# Patient Record
Sex: Male | Born: 1975 | Hispanic: Yes | Marital: Married | Smoking: Never smoker
Health system: Southern US, Community
[De-identification: ages and names within clinical notes are randomized; demographics above are authoritative.]

## PROBLEM LIST (undated history)

## (undated) DIAGNOSIS — R5383 Other fatigue: Secondary | ICD-10-CM

## (undated) DIAGNOSIS — K219 Gastro-esophageal reflux disease without esophagitis: Secondary | ICD-10-CM

## (undated) DIAGNOSIS — F32A Depression, unspecified: Secondary | ICD-10-CM

## (undated) DIAGNOSIS — F329 Major depressive disorder, single episode, unspecified: Secondary | ICD-10-CM

## (undated) DIAGNOSIS — R011 Cardiac murmur, unspecified: Secondary | ICD-10-CM

## (undated) DIAGNOSIS — R002 Palpitations: Secondary | ICD-10-CM

## (undated) DIAGNOSIS — F419 Anxiety disorder, unspecified: Secondary | ICD-10-CM

## (undated) DIAGNOSIS — F410 Panic disorder [episodic paroxysmal anxiety] without agoraphobia: Secondary | ICD-10-CM

## (undated) DIAGNOSIS — F411 Generalized anxiety disorder: Secondary | ICD-10-CM

## (undated) DIAGNOSIS — M542 Cervicalgia: Secondary | ICD-10-CM

## (undated) HISTORY — DX: Panic disorder (episodic paroxysmal anxiety): F41.0

## (undated) HISTORY — DX: Depression, unspecified: F32.A

## (undated) HISTORY — DX: Gastro-esophageal reflux disease without esophagitis: K21.9

## (undated) HISTORY — DX: Cardiac murmur, unspecified: R01.1

## (undated) HISTORY — DX: Major depressive disorder, single episode, unspecified: F32.9

## (undated) HISTORY — DX: Other fatigue: R53.83

## (undated) HISTORY — DX: Anxiety disorder, unspecified: F41.9

## (undated) HISTORY — DX: Generalized anxiety disorder: F41.1

## (undated) HISTORY — DX: Cervicalgia: M54.2

## (undated) HISTORY — PX: NO PAST SURGERIES: SHX2092

## (undated) HISTORY — DX: Palpitations: R00.2

---

## 2004-09-11 ENCOUNTER — Emergency Department: Payer: Self-pay | Admitting: Emergency Medicine

## 2008-08-08 ENCOUNTER — Emergency Department: Payer: Self-pay | Admitting: Emergency Medicine

## 2008-08-13 ENCOUNTER — Emergency Department: Payer: Self-pay | Admitting: Emergency Medicine

## 2009-03-10 ENCOUNTER — Emergency Department: Payer: Self-pay | Admitting: Emergency Medicine

## 2009-03-27 ENCOUNTER — Emergency Department: Payer: Self-pay | Admitting: Emergency Medicine

## 2009-11-10 ENCOUNTER — Emergency Department: Payer: Self-pay | Admitting: Emergency Medicine

## 2010-08-09 IMAGING — CR DG CHEST 2V
1 series · 2 of 2 positions shown · non-contrast
Comparison: none

REASON FOR EXAM: CP
COMMENTS:

PROCEDURE:     DXR - DXR CHEST PA (OR AP) AND LATERAL  - March 10, 2009  [DATE]
RESULT:     The lung fields are clear. No pneumonia, pneumothorax or pleural
effusion is seen. Heart size is normal. There is a mild thoracic scoliosis
with a convexity to the left.

[Series 1: view not recorded · 0.17mm/px · 2 of 2 slices shown]
[im 1/2]
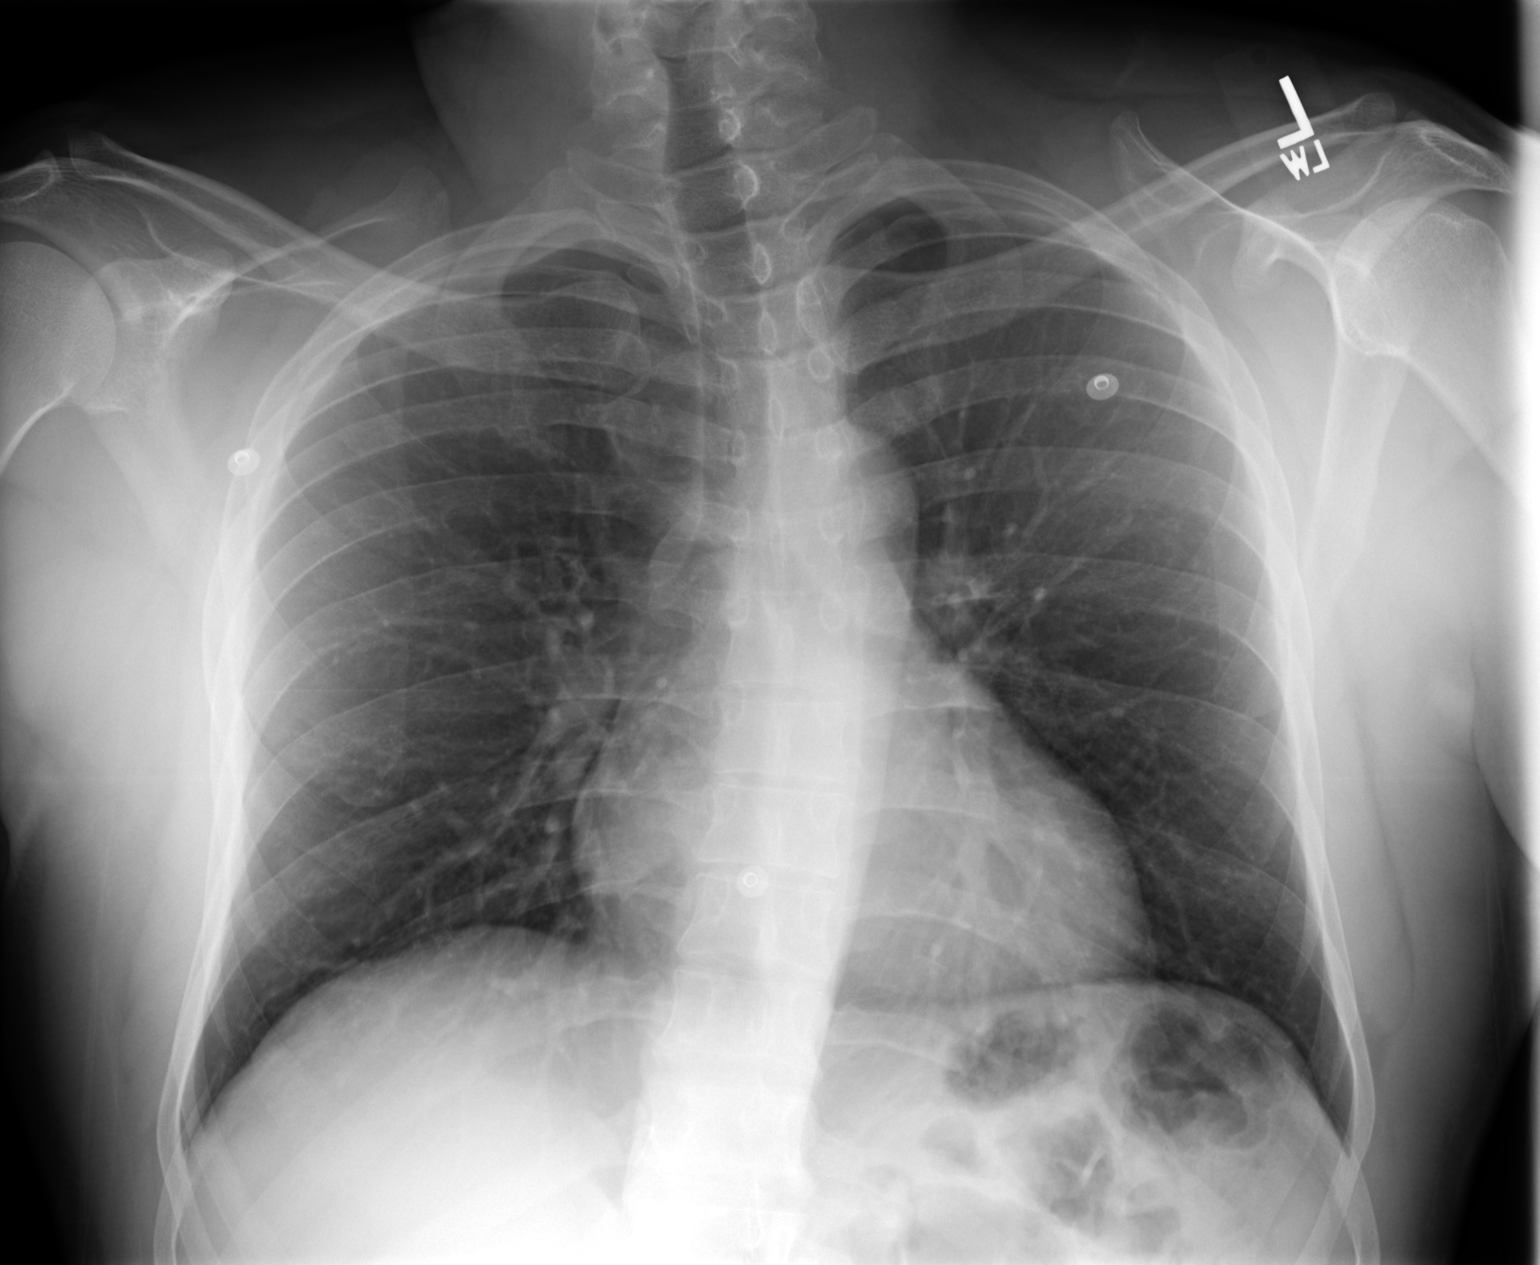
[im 2/2]
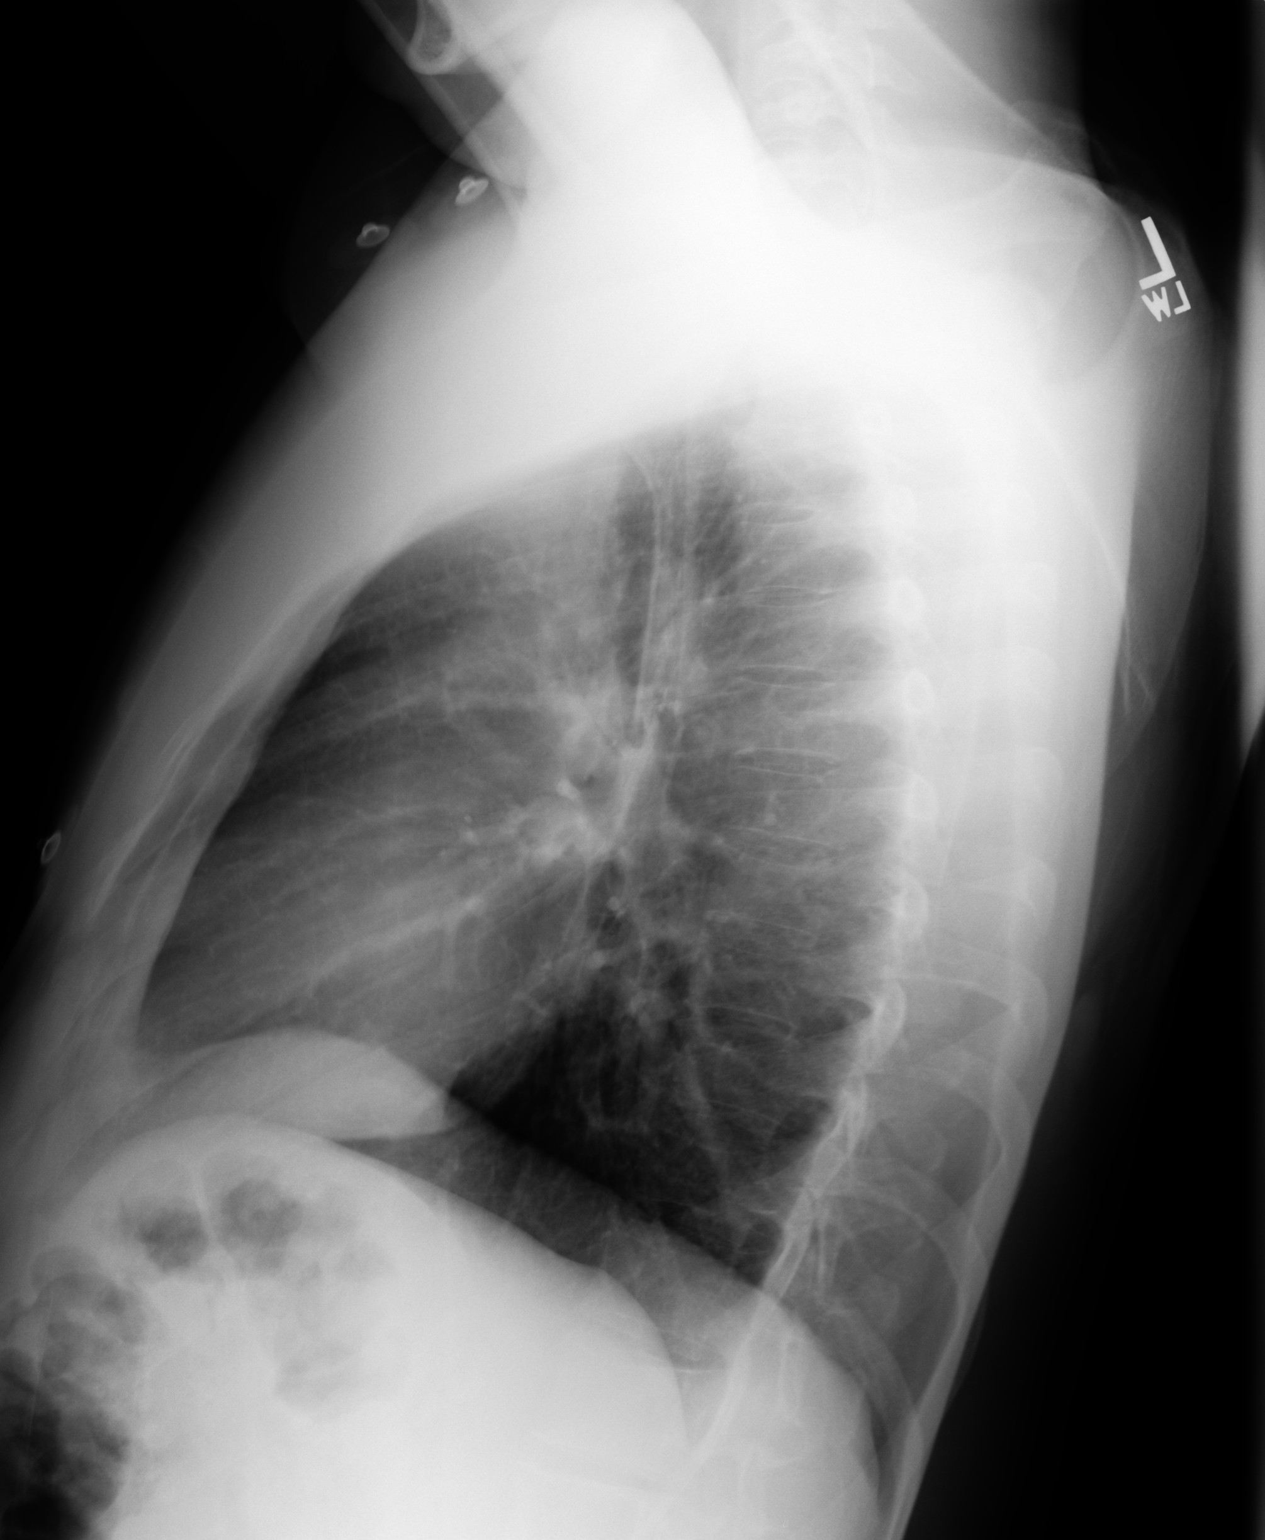

[2 of 2 positions shown; findings below may reference images not displayed]

IMPRESSION: No acute changes are identified.

## 2010-09-29 ENCOUNTER — Emergency Department: Payer: Self-pay | Admitting: Emergency Medicine

## 2011-05-08 DIAGNOSIS — R002 Palpitations: Secondary | ICD-10-CM | POA: Insufficient documentation

## 2011-05-08 HISTORY — DX: Palpitations: R00.2

## 2012-04-13 ENCOUNTER — Emergency Department: Payer: Self-pay | Admitting: Emergency Medicine

## 2012-04-13 LAB — CBC
HGB: 15.7 g/dL (ref 13.0–18.0)
MCH: 29.3 pg (ref 26.0–34.0)
MCV: 85 fL (ref 80–100)
Platelet: 204 10*3/uL (ref 150–440)
RDW: 14.3 % (ref 11.5–14.5)
WBC: 7.9 10*3/uL (ref 3.8–10.6)

## 2012-04-13 LAB — COMPREHENSIVE METABOLIC PANEL
Alkaline Phosphatase: 96 U/L (ref 50–136)
BUN: 12 mg/dL (ref 7–18)
Bilirubin,Total: 0.5 mg/dL (ref 0.2–1.0)
Calcium, Total: 8.8 mg/dL (ref 8.5–10.1)
Chloride: 104 mmol/L (ref 98–107)
Co2: 26 mmol/L (ref 21–32)
EGFR (Non-African Amer.): >60
Glucose: 125 mg/dL — ABNORMAL HIGH (ref 65–99)
Potassium: 3.5 mmol/L (ref 3.5–5.1)
SGOT(AST): 43 U/L — ABNORMAL HIGH (ref 15–37)
SGPT (ALT): 49 U/L (ref 12–78)

## 2012-04-18 ENCOUNTER — Emergency Department: Payer: Self-pay | Admitting: Unknown Physician Specialty

## 2012-04-18 LAB — BASIC METABOLIC PANEL
Anion Gap: 10 (ref 7–16)
BUN: 11 mg/dL (ref 7–18)
Calcium, Total: 9.3 mg/dL (ref 8.5–10.1)
Chloride: 106 mmol/L (ref 98–107)
Co2: 22 mmol/L (ref 21–32)
Creatinine: 1.01 mg/dL (ref 0.60–1.30)
EGFR (African American): >60
Glucose: 114 mg/dL — ABNORMAL HIGH (ref 65–99)
Osmolality: 276 (ref 275–301)
Potassium: 3 mmol/L — ABNORMAL LOW (ref 3.5–5.1)
Sodium: 138 mmol/L (ref 136–145)

## 2012-04-18 LAB — CBC
MCH: 29.8 pg (ref 26.0–34.0)
MCHC: 35.5 g/dL (ref 32.0–36.0)
Platelet: 237 10*3/uL (ref 150–440)
WBC: 8.5 10*3/uL (ref 3.8–10.6)

## 2013-02-02 ENCOUNTER — Emergency Department: Payer: Self-pay | Admitting: Emergency Medicine

## 2013-02-02 LAB — URINALYSIS, COMPLETE
Bacteria: NONE SEEN
Blood: NEGATIVE
Glucose,UR: NEGATIVE mg/dL (ref 0–75)
Ph: 6 (ref 4.5–8.0)
RBC,UR: <1 /HPF (ref 0–5)
Specific Gravity: 1.02 (ref 1.003–1.030)
Squamous Epithelial: <1
WBC UR: 3 /HPF (ref 0–5)

## 2013-02-02 LAB — BASIC METABOLIC PANEL
Anion Gap: 7 (ref 7–16)
BUN: 12 mg/dL (ref 7–18)
Calcium, Total: 8.9 mg/dL (ref 8.5–10.1)
Chloride: 107 mmol/L (ref 98–107)
Co2: 25 mmol/L (ref 21–32)
Creatinine: 1.04 mg/dL (ref 0.60–1.30)
EGFR (Non-African Amer.): >60
Glucose: 100 mg/dL — ABNORMAL HIGH (ref 65–99)
Osmolality: 277 (ref 275–301)
Potassium: 3.4 mmol/L — ABNORMAL LOW (ref 3.5–5.1)
Sodium: 139 mmol/L (ref 136–145)

## 2013-02-02 LAB — DRUG SCREEN, URINE
Barbiturates, Ur Screen: NEGATIVE (ref ?–200)
Benzodiazepine, Ur Scrn: NEGATIVE (ref ?–200)
Cannabinoid 50 Ng, Ur ~~LOC~~: NEGATIVE (ref ?–50)
Cocaine Metabolite,Ur ~~LOC~~: NEGATIVE (ref ?–300)
MDMA (Ecstasy)Ur Screen: NEGATIVE (ref ?–500)
Methadone, Ur Screen: NEGATIVE (ref ?–300)
Opiate, Ur Screen: NEGATIVE (ref ?–300)
Phencyclidine (PCP) Ur S: NEGATIVE (ref ?–25)
Tricyclic, Ur Screen: NEGATIVE (ref ?–1000)

## 2013-02-02 LAB — CBC
HCT: 46.8 % (ref 40.0–52.0)
HGB: 15.7 g/dL (ref 13.0–18.0)
MCH: 28.6 pg (ref 26.0–34.0)
MCHC: 33.7 g/dL (ref 32.0–36.0)
RBC: 5.5 10*6/uL (ref 4.40–5.90)

## 2014-08-01 ENCOUNTER — Emergency Department: Payer: Self-pay | Admitting: Emergency Medicine

## 2015-05-25 DIAGNOSIS — F329 Major depressive disorder, single episode, unspecified: Secondary | ICD-10-CM

## 2015-05-25 DIAGNOSIS — F411 Generalized anxiety disorder: Secondary | ICD-10-CM | POA: Insufficient documentation

## 2015-05-25 DIAGNOSIS — R011 Cardiac murmur, unspecified: Secondary | ICD-10-CM

## 2015-05-25 DIAGNOSIS — M542 Cervicalgia: Secondary | ICD-10-CM

## 2015-05-25 DIAGNOSIS — K219 Gastro-esophageal reflux disease without esophagitis: Secondary | ICD-10-CM

## 2015-05-25 DIAGNOSIS — F32A Depression, unspecified: Secondary | ICD-10-CM

## 2015-05-25 HISTORY — DX: Generalized anxiety disorder: F41.1

## 2015-05-25 HISTORY — DX: Major depressive disorder, single episode, unspecified: F32.9

## 2015-05-25 HISTORY — DX: Gastro-esophageal reflux disease without esophagitis: K21.9

## 2015-05-25 HISTORY — DX: Cardiac murmur, unspecified: R01.1

## 2015-05-29 ENCOUNTER — Ambulatory Visit (INDEPENDENT_AMBULATORY_CARE_PROVIDER_SITE_OTHER): Payer: No Typology Code available for payment source | Admitting: Unknown Physician Specialty

## 2015-05-29 ENCOUNTER — Encounter: Payer: Self-pay | Admitting: Unknown Physician Specialty

## 2015-05-29 VITALS — BP 117/80 | HR 65 | Temp 98.5°F | Ht 67.5 in | Wt 190.2 lb

## 2015-05-29 DIAGNOSIS — Z23 Encounter for immunization: Secondary | ICD-10-CM | POA: Diagnosis not present

## 2015-05-29 DIAGNOSIS — Z0000 Encounter for general adult medical examination without abnormal findings: Secondary | ICD-10-CM | POA: Diagnosis not present

## 2015-05-29 DIAGNOSIS — R5383 Other fatigue: Secondary | ICD-10-CM

## 2015-05-29 LAB — MICROSCOPIC EXAMINATION: RENAL EPITHEL UA: NONE SEEN /HPF

## 2015-05-29 LAB — UA/M W/RFLX CULTURE, ROUTINE
Bilirubin, UA: NEGATIVE
GLUCOSE, UA: NEGATIVE
Ketones, UA: NEGATIVE
Leukocytes, UA: NEGATIVE
NITRITE UA: NEGATIVE
PH UA: 6 (ref 5.0–7.5)
RBC, UA: NEGATIVE
Specific Gravity, UA: 1.025 (ref 1.005–1.030)
UUROB: 0.2 mg/dL (ref 0.2–1.0)

## 2015-05-29 NOTE — Progress Notes (Signed)
   BP 117/80 mmHg  Pulse 65  Temp(Src) 98.5 F (36.9 C)  Ht 5' 7.5" (1.715 m)  Wt 190 lb 3.2 oz (86.274 kg)  BMI 29.33 kg/m2  SpO2 98%   Subjective:    Patient ID: John Dyer, male    DOB: May 17, 1976, 39 y.o.   MRN: [id number]  HPI: John Dyer is a 39 y.o. male  Chief Complaint  Patient presents with  . Annual Exam    pt states he feels tired (especially after eating sometimes), and states he is having muscle spasms (states muscles feel achey even when he has not done anything). Pt also states his temples hurt.    Relevant past medical, surgical, family and social history reviewed and updated as indicated. Interim medical history since our last visit reviewed. Allergies and medications reviewed and updated.  Review of Systems  Constitutional: Positive for fatigue.  HENT: Negative.   Eyes: Negative.   Respiratory: Negative.   Cardiovascular: Negative.   Gastrointestinal: Negative.   Endocrine: Negative.   Genitourinary: Negative.   Skin: Negative.   Allergic/Immunologic: Negative.   Neurological: Negative.   Hematological: Negative.   Psychiatric/Behavioral: Negative.     Per HPI unless specifically indicated above     Objective:    BP 117/80 mmHg  Pulse 65  Temp(Src) 98.5 F (36.9 C)  Ht 5' 7.5" (1.715 m)  Wt 190 lb 3.2 oz (86.274 kg)  BMI 29.33 kg/m2  SpO2 98%  Wt Readings from Last 3 Encounters:  05/29/15 190 lb 3.2 oz (86.274 kg)  07/26/14 194 lb (87.998 kg)    Physical Exam  Constitutional: He is oriented to person, place, and time. He appears well-developed and well-nourished.  HENT:  Head: Normocephalic.  Eyes: Pupils are equal, round, and reactive to light.  Cardiovascular: Normal rate, regular rhythm and normal heart sounds.   Pulmonary/Chest: Effort normal.  Abdominal: Soft. Bowel sounds are normal.  Musculoskeletal: Normal range of motion.  Neurological: He is alert and oriented to person, place, and time. He has normal reflexes.  Skin:  Skin is warm and dry.  Psychiatric: He has a normal mood and affect. His behavior is normal. Judgment and thought content normal.      Assessment & Plan:   Problem List Items Addressed This Visit    None    Visit Diagnoses    Immunization due    -  Primary    Relevant Orders    Flu Vaccine QUAD 36+ mos PF IM (Fluarix & Fluzone Quad PF) (Completed)    Annual physical exam        Relevant Orders    CBC with Differential/Platelet    Comprehensive metabolic panel    HIV antibody    TSH    Lipid Panel w/o Chol/HDL Ratio    Vit D  25 hydroxy (rtn osteoporosis monitoring)    UA/M w/rflx Culture, Routine    Other fatigue            Follow up plan: Return in about 1 year (around 05/28/2016).

## 2015-05-30 ENCOUNTER — Encounter: Payer: Self-pay | Admitting: Unknown Physician Specialty

## 2015-05-30 LAB — CBC WITH DIFFERENTIAL/PLATELET
BASOS ABS: 0.1 10*3/uL (ref 0.0–0.2)
Basos: 1 %
EOS (ABSOLUTE): 0.4 10*3/uL (ref 0.0–0.4)
Eos: 6 %
HEMOGLOBIN: 17.2 g/dL (ref 12.6–17.7)
Hematocrit: 49 % (ref 37.5–51.0)
Immature Grans (Abs): 0 10*3/uL (ref 0.0–0.1)
Immature Granulocytes: 0 %
LYMPHS ABS: 2.3 10*3/uL (ref 0.7–3.1)
LYMPHS: 35 %
MCH: 29.9 pg (ref 26.6–33.0)
MCHC: 35.1 g/dL (ref 31.5–35.7)
MCV: 85 fL (ref 79–97)
MONOCYTES: 6 %
Monocytes Absolute: 0.4 10*3/uL (ref 0.1–0.9)
NEUTROS PCT: 52 %
Neutrophils Absolute: 3.4 10*3/uL (ref 1.4–7.0)
Platelets: 252 10*3/uL (ref 150–379)
RBC: 5.76 x10E6/uL (ref 4.14–5.80)
RDW: 14.6 % (ref 12.3–15.4)
WBC: 6.5 10*3/uL (ref 3.4–10.8)

## 2015-05-30 LAB — LIPID PANEL W/O CHOL/HDL RATIO
Cholesterol, Total: 237 mg/dL — ABNORMAL HIGH (ref 100–199)
HDL: 48 mg/dL (ref >39)
LDL Calculated: 141 mg/dL — ABNORMAL HIGH (ref 0–99)
Triglycerides: 242 mg/dL — ABNORMAL HIGH (ref 0–149)
VLDL Cholesterol Cal: 48 mg/dL — ABNORMAL HIGH (ref 5–40)

## 2015-05-30 LAB — COMPREHENSIVE METABOLIC PANEL
ALBUMIN: 4.6 g/dL (ref 3.5–5.5)
ALK PHOS: 84 IU/L (ref 39–117)
ALT: 41 IU/L (ref 0–44)
AST: 29 IU/L (ref 0–40)
Albumin/Globulin Ratio: 1.7 (ref 1.1–2.5)
BUN / CREAT RATIO: 14 (ref 8–19)
BUN: 10 mg/dL (ref 6–20)
Bilirubin Total: 0.5 mg/dL (ref 0.0–1.2)
CO2: 25 mmol/L (ref 18–29)
CREATININE: 0.74 mg/dL — AB (ref 0.76–1.27)
Calcium: 9.8 mg/dL (ref 8.7–10.2)
Chloride: 101 mmol/L (ref 97–108)
GFR calc Af Amer: 136 mL/min/{1.73_m2} (ref >59)
GFR calc non Af Amer: 118 mL/min/{1.73_m2} (ref >59)
GLUCOSE: 77 mg/dL (ref 65–99)
Globulin, Total: 2.7 g/dL (ref 1.5–4.5)
Potassium: 4.7 mmol/L (ref 3.5–5.2)
Sodium: 142 mmol/L (ref 134–144)
Total Protein: 7.3 g/dL (ref 6.0–8.5)

## 2015-05-30 LAB — VITAMIN D 25 HYDROXY (VIT D DEFICIENCY, FRACTURES): VIT D 25 HYDROXY: 19.4 ng/mL — AB (ref 30.0–100.0)

## 2015-05-30 LAB — TSH: TSH: 1.37 u[IU]/mL (ref 0.450–4.500)

## 2015-05-30 LAB — HIV ANTIBODY (ROUTINE TESTING W REFLEX): HIV SCREEN 4TH GENERATION: NONREACTIVE

## 2015-06-06 ENCOUNTER — Emergency Department
Admission: EM | Admit: 2015-06-06 | Discharge: 2015-06-06 | Disposition: A | Discharge status: Home / Self Care | Payer: No Typology Code available for payment source | Attending: Emergency Medicine | Admitting: Emergency Medicine

## 2015-06-06 ENCOUNTER — Emergency Department: Payer: No Typology Code available for payment source

## 2015-06-06 ENCOUNTER — Encounter: Payer: Self-pay | Admitting: Emergency Medicine

## 2015-06-06 DIAGNOSIS — Z79899 Other long term (current) drug therapy: Secondary | ICD-10-CM | POA: Insufficient documentation

## 2015-06-06 DIAGNOSIS — K219 Gastro-esophageal reflux disease without esophagitis: Secondary | ICD-10-CM | POA: Insufficient documentation

## 2015-06-06 DIAGNOSIS — R0789 Other chest pain: Secondary | ICD-10-CM | POA: Diagnosis present

## 2015-06-06 DIAGNOSIS — Z880 Allergy status to penicillin: Secondary | ICD-10-CM | POA: Diagnosis not present

## 2015-06-06 LAB — BASIC METABOLIC PANEL
ANION GAP: 8 (ref 5–15)
BUN: 10 mg/dL (ref 6–20)
CALCIUM: 9.4 mg/dL (ref 8.9–10.3)
CO2: 32 mmol/L (ref 22–32)
Chloride: 102 mmol/L (ref 101–111)
Creatinine, Ser: 0.85 mg/dL (ref 0.61–1.24)
GFR calc Af Amer: >60 mL/min (ref >60)
Glucose, Bld: 97 mg/dL (ref 65–99)
POTASSIUM: 3.6 mmol/L (ref 3.5–5.1)
SODIUM: 142 mmol/L (ref 135–145)

## 2015-06-06 LAB — HEPATIC FUNCTION PANEL
ALBUMIN: 4.5 g/dL (ref 3.5–5.0)
ALT: 45 U/L (ref 17–63)
AST: 39 U/L (ref 15–41)
Alkaline Phosphatase: 66 U/L (ref 38–126)
BILIRUBIN TOTAL: 0.8 mg/dL (ref 0.3–1.2)
Bilirubin, Direct: 0.1 mg/dL (ref 0.1–0.5)
Indirect Bilirubin: 0.7 mg/dL (ref 0.3–0.9)
TOTAL PROTEIN: 7.3 g/dL (ref 6.5–8.1)

## 2015-06-06 LAB — CBC
HEMATOCRIT: 46.8 % (ref 40.0–52.0)
HEMOGLOBIN: 16 g/dL (ref 13.0–18.0)
MCH: 29.3 pg (ref 26.0–34.0)
MCHC: 34.3 g/dL (ref 32.0–36.0)
MCV: 85.6 fL (ref 80.0–100.0)
Platelets: 237 10*3/uL (ref 150–440)
RBC: 5.46 MIL/uL (ref 4.40–5.90)
RDW: 14.3 % (ref 11.5–14.5)
WBC: 8.7 10*3/uL (ref 3.8–10.6)

## 2015-06-06 LAB — LIPASE, BLOOD: LIPASE: 35 U/L (ref 22–51)

## 2015-06-06 LAB — TROPONIN I: Troponin I: <0.03 ng/mL (ref <0.031)

## 2015-06-06 NOTE — ED Notes (Signed)
Patient ambulatory to triage with steady gait, without difficulty or distress noted; pt reports last night after "relations with my wife", began having mid chest pressure accomp by SOB, constant since; denies hx of same

## 2015-06-06 NOTE — ED Provider Notes (Addendum)
[facility] Emergency Department Provider Note  ____________________________________________   I have reviewed the triage vital signs and the nursing notes.   HISTORY  Chief Complaint Chest Pain    HPI John Dyer is a 39 y.o. male , healthy, history of GERD, anxiety, panic disorder, multiple different EKGs at this facility, and remote history of heart murmur presents today with "sour stomach" and discomfort in his chest which he thinks is reflux disease. His been there uninterrupted for the last24 hours. He states to me that he is not short of breath, he has no pleuritic chest pain. Triage note is noted. Patient did have a little epigastric discomfort as well. He take lime and baking soda at his mother's suggestion and his symptoms are now "pretty much gone". He has had no fever no chills no exertional chest pain or recent travel no personal or family history of PE or CAD no personal or family history of DVT, as had no recent travel he takes no estrogen she has no history of cancer he has no recent surgeries he has no leg swelling and he has no exertional chest discomfort. The patient denies any hematemesis nausea vomiting or diarrhea. He denies any melena or bright blood per rectum. He states right now he feels "pretty good". The only thing that made the symptoms worse with food and the only thing that made it better with his bicarbonate and Lime combination  Past Medical History  Diagnosis Date  . Heart murmur   . Depression   . GERD (gastroesophageal reflux disease)   . Anxiety   . Panic disorder   . Cervicalgia   . Fatigue     Patient Active Problem List   Diagnosis Date Noted  . Heart murmur 05/25/2015  . Depressive disorder 05/25/2015  . GERD (gastroesophageal reflux disease) 05/25/2015  . GAD (generalized anxiety disorder) 05/25/2015  . Cervicalgia 05/25/2015    History reviewed. No pertinent past surgical history.  Current Outpatient Rx  Name   Route  Sig  Dispense  Refill  . dorzolamide-timolol (COSOPT) 22.3-6.8 MG/ML ophthalmic solution   Both Eyes   Place 1 drop into both eyes daily.           Allergies Penicillin g benzathine and Prozac  Family History  Problem Relation Age of Onset  . Heart disease Father     murmur  . Stroke Father   . Cancer Maternal Grandmother     leukemia    Social History Social History  Substance Use Topics  . Smoking status: Never Smoker   . Smokeless tobacco: Never Used  . Alcohol Use: No    Review of Systems Constitutional: No fever/chills Eyes: No visual changes. ENT: No sore throat. No stiff neck no neck pain Cardiovascular: See history of present illness Respiratory: Denies shortness of breath. Gastrointestinal:   no vomiting.  No diarrhea.  No constipation. Genitourinary: Negative for dysuria. Musculoskeletal: Negative lower extremity swelling Skin: Negative for rash. Neurological: Negative for headaches, focal weakness or numbness. 10-point ROS otherwise negative.  ____________________________________________   PHYSICAL EXAM:  VITAL SIGNS: ED Triage Vitals  Enc Vitals Group     BP 06/06/15 2005 160/87 mmHg     Pulse Rate 06/06/15 2005 81     Resp 06/06/15 2005 20     Temp 06/06/15 2005 98 F (36.7 C)     Temp Source 06/06/15 2005 Oral     SpO2 06/06/15 2005 98 %     Weight 06/06/15 2005 190  lb (86.183 kg)     Height 06/06/15 2005 5\' 8"  (1.727 m)     Head Cir --      Peak Flow --      Pain Score 06/06/15 2003 2     Pain Loc --      Pain Edu? --      Excl. in GC? --     Constitutional: Alert and oriented. Well appearing and in no acute distress. Eyes: Conjunctivae are normal. PERRL. EOMI. Head: Atraumatic. Nose: No congestion/rhinnorhea. Mouth/Throat: Mucous membranes are moist.  Oropharynx non-erythematous. Neck: No stridor.   Nontender with no meningismus Cardiovascular: Normal rate, regular rhythm. Grossly normal heart sounds.  Good peripheral  circulation. Respiratory: Normal respiratory effort.  No retractions. Lungs CTAB. Gastrointestinal: Soft minimal epigastric tenderness noted, no right upper quadrant pain No distention. No guarding no rebound Back:  There is no focal tenderness or step off there is no midline tenderness there are no lesions noted. there is no CVA tenderness Musculoskeletal: No lower extremity tenderness. No joint effusions, no DVT signs strong distal pulses no edema Neurologic:  Normal speech and language. No gross focal neurologic deficits are appreciated.  Skin:  Skin is warm, dry and intact. No rash noted. Psychiatric: Mood and affect are normal. Speech and behavior are normal.  ____________________________________________   LABS (all labs ordered are listed, but only abnormal results are displayed)  Labs Reviewed  BASIC METABOLIC PANEL  CBC  TROPONIN I   ____________________________________________  EKG  EKG interpreted by me, normal sinus rhythm rate 76 bpm no acute ST elevation or depression, mostly ST changes. LAD noted. ____________________________________________  RADIOLOGY  Radiology reviewed by me ____________________________________________   PROCEDURES  Procedure(s) performed: None  Critical Care performed: None  ____________________________________________   INITIAL IMPRESSION / ASSESSMENT AND PLAN / ED COURSE  Pertinent labs & imaging results that were available during my care of the patient were reviewed by me and considered in my medical decision making (see chart for details).  Patient with epigastric pain and "burning" which is worse with food, better with baking soda, and somewhat reproducible midepigastric region. This is most like her GERD. I do not think that serial cardiac enzymes will be of great utility given the fact that the patient has had uninterrupted symptoms for the last 24 hours. At this time, he is acting pain-free after trying his bicarbonate home he  just wanted to "make sure" that everything is okay. Chest x-ray and blood levels and EKG are reassuring. There is no evidence of this time of acute gallbladder disease but we'll check hepatic function tests and her lipase is a precaution. Patient is perk negative and I do not think he has a PE. I do not think this represents myocarditis endocarditis pericarditis pneumothorax pneumonia or any other acute intrathoracic or intra-abdominal pathology at this time. Nothing to suggest dissection. ____________________________________________  ----------------------------------------- 11:16 PM on 06/06/2015 -----------------------------------------  Patient has no complaints sitting there in the bed with his legs crossed watching television. He has no pain here, he is eager to go home. Extensive return precautions and follow-up given and understood. Patient will follow closely with his primary care doctor. He will take antacids at home, he understands that he must come back for new or worrisome symptoms.    FINAL CLINICAL IMPRESSION(S) / ED DIAGNOSES  Final diagnoses:  None     Jeanmarie PlantJames A Wesson Stith, MD 06/06/15 2207  Jeanmarie PlantJames A Chucky Homes, MD 06/06/15 [phone]

## 2015-06-06 NOTE — Discharge Instructions (Signed)
At this time, we are reassured by what we see. If you have increased pain, fever, chills, shortness of breath, or you feel worse in any way please return to the emergency department.

## 2015-08-15 ENCOUNTER — Encounter: Payer: Self-pay | Admitting: Emergency Medicine

## 2015-08-15 ENCOUNTER — Emergency Department
Admission: EM | Admit: 2015-08-15 | Discharge: 2015-08-15 | Disposition: A | Discharge status: Home / Self Care | Payer: No Typology Code available for payment source | Attending: Emergency Medicine | Admitting: Emergency Medicine

## 2015-08-15 ENCOUNTER — Emergency Department: Payer: No Typology Code available for payment source

## 2015-08-15 ENCOUNTER — Other Ambulatory Visit: Payer: Self-pay

## 2015-08-15 DIAGNOSIS — Z79899 Other long term (current) drug therapy: Secondary | ICD-10-CM | POA: Diagnosis not present

## 2015-08-15 DIAGNOSIS — R002 Palpitations: Secondary | ICD-10-CM | POA: Diagnosis present

## 2015-08-15 DIAGNOSIS — F419 Anxiety disorder, unspecified: Secondary | ICD-10-CM | POA: Insufficient documentation

## 2015-08-15 DIAGNOSIS — R000 Tachycardia, unspecified: Secondary | ICD-10-CM | POA: Insufficient documentation

## 2015-08-15 DIAGNOSIS — Z880 Allergy status to penicillin: Secondary | ICD-10-CM | POA: Diagnosis not present

## 2015-08-15 LAB — BASIC METABOLIC PANEL
ANION GAP: 9 (ref 5–15)
BUN: 13 mg/dL (ref 6–20)
CALCIUM: 9.3 mg/dL (ref 8.9–10.3)
CO2: 26 mmol/L (ref 22–32)
Chloride: 103 mmol/L (ref 101–111)
Creatinine, Ser: 0.89 mg/dL (ref 0.61–1.24)
Glucose, Bld: 160 mg/dL — ABNORMAL HIGH (ref 65–99)
Potassium: 3.3 mmol/L — ABNORMAL LOW (ref 3.5–5.1)
Sodium: 138 mmol/L (ref 135–145)

## 2015-08-15 LAB — CBC
HCT: 46.5 % (ref 40.0–52.0)
Hemoglobin: 16 g/dL (ref 13.0–18.0)
MCH: 29 pg (ref 26.0–34.0)
MCHC: 34.4 g/dL (ref 32.0–36.0)
MCV: 84.4 fL (ref 80.0–100.0)
PLATELETS: 236 10*3/uL (ref 150–440)
RBC: 5.51 MIL/uL (ref 4.40–5.90)
RDW: 13.7 % (ref 11.5–14.5)
WBC: 8.4 10*3/uL (ref 3.8–10.6)

## 2015-08-15 LAB — TROPONIN I: Troponin I: <0.03 ng/mL (ref <0.031)

## 2015-08-15 MED ORDER — ALPRAZOLAM 0.5 MG PO TABS: 0.5000 mg | ORAL_TABLET | Freq: Every day | ORAL | Status: DC | PRN

## 2015-08-15 NOTE — Discharge Instructions (Signed)

## 2015-08-15 NOTE — ED Notes (Signed)
Pt states he felt like his heart rate was in the 180's around 1620 today, denies any pain and not taking any new meds. No acute distress noted.

## 2015-08-15 NOTE — ED Provider Notes (Signed)
[facility] Emergency Department Provider Note  ____________________________________________   I have reviewed the triage vital signs and the nursing notes.   HISTORY  Chief Complaint Palpitations    HPI John Dyer is a 39 y.o. male with a history of panic attacks states that he had a panic attack today. He states that his heart rate was a little bit up as he noticed on his Apple watch, and he became concerned about this and the more he thought about at the higher his heart rate would go. He denies any chest pain or shortness of breath. He states he became more and more anxious about it and not more anxious he became the higher his watch told him his heart rate was going.Patient states he is sure that this was an anxiety attack. He has had extensive workup for this in the past including Holter monitors, [facility] because he has a slight murmur had a normal echo and was assured that this was not cardiogenic. Patient has tried Prozac but it seemed like it made the symptoms worse. He has no SI or HI. Since he has been able to calm himself down here, the patient feels much better and his heart rate is Dr. normal heel like to go home. He has not seen a psychiatrist for this evening followed by his primary care doctor and he has never tried a benzodiazepine.  Past Medical History  Diagnosis Date  . Heart murmur   . Depression   . GERD (gastroesophageal reflux disease)   . Anxiety   . Panic disorder   . Cervicalgia   . Fatigue     Patient Active Problem List   Diagnosis Date Noted  . Heart murmur 05/25/2015  . Depressive disorder 05/25/2015  . GERD (gastroesophageal reflux disease) 05/25/2015  . GAD (generalized anxiety disorder) 05/25/2015  . Cervicalgia 05/25/2015    History reviewed. No pertinent past surgical history.  Current Outpatient Rx  Name  Route  Sig  Dispense  Refill  . dorzolamide-timolol (COSOPT) 22.3-6.8 MG/ML  ophthalmic solution   Both Eyes   Place 1 drop into both eyes daily.           Allergies Penicillin g benzathine and Prozac  Family History  Problem Relation Age of Onset  . Heart disease Father     murmur  . Stroke Father   . Cancer Maternal Grandmother     leukemia    Social History Social History  Substance Use Topics  . Smoking status: Never Smoker   . Smokeless tobacco: Never Used  . Alcohol Use: No    Review of Systems Constitutional: No fever/chills Eyes: No visual changes. ENT: No sore throat. No stiff neck no neck pain Cardiovascular: Denies chest pain. Respiratory: Denies shortness of breath. Gastrointestinal:   no vomiting.  No diarrhea.  No constipation. Genitourinary: Negative for dysuria. Musculoskeletal: Negative lower extremity swelling Skin: Negative for rash. Neurological: Negative for headaches, focal weakness or numbness. 10-point ROS otherwise negative.  ____________________________________________   PHYSICAL EXAM:  VITAL SIGNS: ED Triage Vitals  Enc Vitals Group     BP 08/15/15 1636 160/87 mmHg     Pulse Rate 08/15/15 1636 105     Resp 08/15/15 1636 20     Temp --      Temp src --      SpO2 08/15/15 1636 100 %     Weight 08/15/15 1636 180 lb (81.647 kg)  Height 08/15/15 1636  (1.753 m)     Head Cir --      Peak Flow --      Pain Score 08/15/15 1637 0     Pain Loc --      Pain Edu? --      Excl. in GC? --     Constitutional: Alert and oriented. Well appearing and in no acute distress. Eyes: Conjunctivae are normal. PERRL. EOMI. Head: Atraumatic. Nose: No congestion/rhinnorhea. Mouth/Throat: Mucous membranes are moist.  Oropharynx non-erythematous. Neck: No stridor.   Nontender with no meningismus Cardiovascular: Normal rate, regular rhythm. Grossly normal heart sounds.  Good peripheral circulation. Respiratory: Normal respiratory effort.  No retractions. Lungs CTAB. Abdominal: Soft and nontender. No distention. No  guarding no rebound Back:  There is no focal tenderness or step off there is no midline tenderness there are no lesions noted. there is no CVA tenderness Musculoskeletal: No lower extremity tenderness. No joint effusions, no DVT signs strong distal pulses no edema Neurologic:  Normal speech and language. No gross focal neurologic deficits are appreciated.  Skin:  Skin is warm, dry and intact. No rash noted. Psychiatric: Mood and affect are mildly anxious. Speech and behavior are normal.  ____________________________________________   LABS (all labs ordered are listed, but only abnormal results are displayed)  Labs Reviewed  BASIC METABOLIC PANEL - Abnormal; Notable for the following:    Potassium 3.3 (*)    Glucose, Bld 160 (*)    All other components within normal limits  TROPONIN I  CBC   ____________________________________________  EKG  I personally interpreted any EKGs ordered by me or triage Sinus tachycardia rate 104 no acute ST elevation or acute ST depression normal axis unremarkable EKG aside from mild tachycardia ____________________________________________  RADIOLOGY  I reviewed any imaging ordered by me or triage that were performed during my shift ____________________________________________   PROCEDURES  Procedure(s) performed: None  Critical Care performed: None  ____________________________________________   INITIAL IMPRESSION / ASSESSMENT AND PLAN / ED COURSE  Pertinent labs & imaging results that were available during my care of the patient were reviewed by me and considered in my medical decision making (see chart for details).  Patient apparently had an elevated heart rate at home to the extent that one can determine from an apple watch. Certainly his heart rate was elevated though because he has a history of panic attacks and elevated heart rate. This time his heart rate is 83. He is in no distress and eager to go home. I do not see anything that  I need to acutely intervene on at this time. Certainly possibly an SVT that is resolved but I think it more likely that he is correct and this was an anxiety attack. I will give him a prescription for benzodiazepines to take only in the case of significant panic attack, I will refer him to psychiatry. This for his heart is concerned I will refer him back to his [facility] cardiologist and he has a number and will call them I'll advise that he again where Holter monitor to ensure that there is no other organic cardiac pathology present. Nothing just that the patient has a PE or ACS or dissection or ongoing dysrhythmia. ____________________________________________   FINAL CLINICAL IMPRESSION(S) / ED DIAGNOSES  Final diagnoses:  None     Jeanmarie Plant, MD 08/15/15 2037

## 2015-08-15 NOTE — ED Notes (Signed)
Patient states he is improving but still feels like heart rate is high. Just wants to find out why this happened.

## 2015-09-22 ENCOUNTER — Ambulatory Visit: Payer: No Typology Code available for payment source | Admitting: Family Medicine

## 2015-10-26 ENCOUNTER — Other Ambulatory Visit: Payer: Self-pay | Admitting: Family Medicine

## 2015-10-26 ENCOUNTER — Ambulatory Visit: Payer: No Typology Code available for payment source | Admitting: Family Medicine

## 2015-10-26 ENCOUNTER — Encounter: Payer: Self-pay | Admitting: Family Medicine

## 2015-10-26 DIAGNOSIS — R300 Dysuria: Secondary | ICD-10-CM

## 2015-12-27 ENCOUNTER — Encounter: Payer: Self-pay | Admitting: *Deleted

## 2015-12-27 ENCOUNTER — Emergency Department: Payer: No Typology Code available for payment source

## 2015-12-27 ENCOUNTER — Emergency Department
Admission: EM | Admit: 2015-12-27 | Discharge: 2015-12-27 | Disposition: A | Discharge status: Home / Self Care | Payer: No Typology Code available for payment source | Attending: Emergency Medicine | Admitting: Emergency Medicine

## 2015-12-27 DIAGNOSIS — R079 Chest pain, unspecified: Secondary | ICD-10-CM

## 2015-12-27 DIAGNOSIS — R0789 Other chest pain: Secondary | ICD-10-CM | POA: Insufficient documentation

## 2015-12-27 DIAGNOSIS — F329 Major depressive disorder, single episode, unspecified: Secondary | ICD-10-CM | POA: Insufficient documentation

## 2015-12-27 LAB — CBC
HEMATOCRIT: 49.6 % (ref 40.0–52.0)
HEMOGLOBIN: 16.3 g/dL (ref 13.0–18.0)
MCH: 28.5 pg (ref 26.0–34.0)
MCHC: 32.9 g/dL (ref 32.0–36.0)
MCV: 86.6 fL (ref 80.0–100.0)
Platelets: 239 10*3/uL (ref 150–440)
RBC: 5.72 MIL/uL (ref 4.40–5.90)
RDW: 14.4 % (ref 11.5–14.5)
WBC: 9.6 10*3/uL (ref 3.8–10.6)

## 2015-12-27 LAB — URINALYSIS COMPLETE WITH MICROSCOPIC (ARMC ONLY)
BACTERIA UA: NONE SEEN
Bilirubin Urine: NEGATIVE
GLUCOSE, UA: NEGATIVE mg/dL
Hgb urine dipstick: NEGATIVE
Ketones, ur: NEGATIVE mg/dL
Leukocytes, UA: NEGATIVE
Nitrite: NEGATIVE
PROTEIN: NEGATIVE mg/dL
RBC / HPF: NONE SEEN RBC/hpf (ref 0–5)
Specific Gravity, Urine: 1.003 — ABNORMAL LOW (ref 1.005–1.030)
pH: 7 (ref 5.0–8.0)

## 2015-12-27 LAB — BASIC METABOLIC PANEL
Anion gap: 8 (ref 5–15)
BUN: 10 mg/dL (ref 6–20)
CHLORIDE: 104 mmol/L (ref 101–111)
CO2: 25 mmol/L (ref 22–32)
Calcium: 9.5 mg/dL (ref 8.9–10.3)
Creatinine, Ser: 0.93 mg/dL (ref 0.61–1.24)
GFR calc Af Amer: >60 mL/min (ref >60)
GFR calc non Af Amer: >60 mL/min (ref >60)
GLUCOSE: 189 mg/dL — AB (ref 65–99)
POTASSIUM: 3.2 mmol/L — AB (ref 3.5–5.1)
Sodium: 137 mmol/L (ref 135–145)

## 2015-12-27 LAB — TROPONIN I
Troponin I: 0.03 ng/mL (ref <0.031)
Troponin I: <0.03 ng/mL (ref <0.031)

## 2015-12-27 MED ORDER — LORAZEPAM 1 MG PO TABS: 1.0000 mg | ORAL_TABLET | Freq: Two times a day (BID) | ORAL | Status: DC

## 2015-12-27 NOTE — ED Provider Notes (Signed)
[facility] Emergency Department Provider Note        Time seen: ----------------------------------------- 4:48 PM on 12/27/2015 -----------------------------------------    I have reviewed the triage vital signs and the nursing notes.   HISTORY  Chief Complaint Chest Pain    HPI Rex Oesterle is a 40 y.o. male who presents ER for left-sided chest pressure that radiates to his shoulder and arm. Patient states he was walking into [employer] and his heart rate was in the 160s at that time. Patient states he felt anxious, does have a history of panic attacks but this felt different. He also has some shortness of breath associated. Patient states she just finished taking an antibiotic for UTI.   Past Medical History  Diagnosis Date  . Heart murmur   . Depression   . GERD (gastroesophageal reflux disease)   . Anxiety   . Panic disorder   . Cervicalgia   . Fatigue     Patient Active Problem List   Diagnosis Date Noted  . Heart murmur 05/25/2015  . Depressive disorder 05/25/2015  . GERD (gastroesophageal reflux disease) 05/25/2015  . GAD (generalized anxiety disorder) 05/25/2015  . Cervicalgia 05/25/2015    History reviewed. No pertinent past surgical history.  Allergies Penicillin g benzathine and Prozac  Social History Social History  Substance Use Topics  . Smoking status: Never Smoker   . Smokeless tobacco: Never Used  . Alcohol Use: No    Review of Systems Constitutional: Negative for fever. Eyes: Negative for visual changes. ENT: Negative for sore throat. Cardiovascular: Positive for chest pain, palpitations Respiratory: Positive shortness of breath Gastrointestinal: Negative for abdominal pain, vomiting and diarrhea. Genitourinary: Negative for dysuria. Musculoskeletal: Negative for back pain. Skin: Negative for rash. Neurological: Negative for headaches, focal weakness or numbness.  10-point ROS otherwise  negative.  ____________________________________________   PHYSICAL EXAM:  VITAL SIGNS: ED Triage Vitals  Enc Vitals Group     BP 12/27/15 1549 121/81 mmHg     Pulse Rate 12/27/15 1549 107     Resp 12/27/15 1549 18     Temp 12/27/15 1549 98.3 F (36.8 C)     Temp Source 12/27/15 1549 Oral     SpO2 12/27/15 1549 100 %     Weight 12/27/15 1549 188 lb (85.276 kg)     Height 12/27/15 1549  (1.753 m)     Head Cir --      Peak Flow --      Pain Score 12/27/15 1550 4     Pain Loc --      Pain Edu? --      Excl. in GC? --     Constitutional: Alert and oriented. Well appearing and in no distress. Eyes: Conjunctivae are normal. PERRL. Normal extraocular movements. ENT   Head: Normocephalic and atraumatic.   Nose: No congestion/rhinnorhea.   Mouth/Throat: Mucous membranes are moist.   Neck: No stridor. Cardiovascular: Normal rate, regular rhythm. No murmurs, rubs, or gallops. Respiratory: Normal respiratory effort without tachypnea nor retractions. Breath sounds are clear and equal bilaterally. No wheezes/rales/rhonchi. Gastrointestinal: Soft and nontender. Normal bowel sounds Musculoskeletal: Nontender with normal range of motion in all extremities. No lower extremity tenderness nor edema. Neurologic:  Normal speech and language. No gross focal neurologic deficits are appreciated.  Skin:  Skin is warm, dry and intact. No rash noted. Psychiatric: Mood and affect are normal. Speech and behavior are normal.  ____________________________________________  EKG: Interpreted by me. Sinus tachycardia with a rate of  107 bpm, normal PR, normal QRS, normal QT interval. Left anterior fascicular block.  ____________________________________________  ED COURSE:  Pertinent labs & imaging results that were available during my care of the patient were reviewed by me and considered in my medical decision making (see chart for details). Patient likely with a panic attack, we'll  check cardiac labs and reevaluate. ____________________________________________    LABS (pertinent positives/negatives)  Labs Reviewed  BASIC METABOLIC PANEL - Abnormal; Notable for the following:    Potassium 3.2 (*)    Glucose, Bld 189 (*)    All other components within normal limits  URINALYSIS COMPLETEWITH MICROSCOPIC ([facility] ONLY) - Abnormal; Notable for the following:    Color, Urine STRAW (*)    APPearance CLEAR (*)    Specific Gravity, Urine 1.003 (*)    Squamous Epithelial / LPF 0-5 (*)    All other components within normal limits  CBC  TROPONIN I  TROPONIN I    RADIOLOGY Images were viewed by me  Chest x-ray  IMPRESSION: No active cardiopulmonary disease. ____________________________________________  FINAL ASSESSMENT AND PLAN  Chest pain  Plan: Patient with labs and imaging as dictated above. Patient presents to ER with chest pain of uncertain etiology. Serial troponins are unremarkable. He has seen a cardiologist and has had an echocardiogram in the past. He may have had a transient episode of SVT but this is been seen before by cardiologist and has been diagnosed with a panic attack. Patient be discharged Ativan to take as needed and encouraged to follow up with his cardiologist.   Emily FilbertWilliams, Audyn Dimercurio E, MD   Note: This dictation was prepared with Dragon dictation. Any transcriptional errors that result from this process are unintentional   Emily FilbertJonathan E Keion Neels, MD 12/27/15 [phone]

## 2015-12-27 NOTE — ED Notes (Signed)
States sudden onset of dizziness and chest pain with SOB that began at [employer], states left shoulder pain, states he just recently finished abx for UTI, states itching in hands and feet

## 2015-12-27 NOTE — ED Notes (Signed)
PT c/o left sided chest pressure that radiates to left shoulder and arm. Pt had pain while he was walking into [employer], states HR was in 160's at that time and he felt anxious. Pt alert and oriented X4, active, cooperative, pt in NAD. RR even and unlabored, color WNL.

## 2015-12-27 NOTE — Discharge Instructions (Signed)
Nonspecific Chest Pain  °Chest pain can be caused by many different conditions. There is always a chance that your pain could be related to something serious, such as a heart attack or a blood clot in your lungs. Chest pain can also be caused by conditions that are not life-threatening. If you have chest pain, it is very important to follow up with your health care provider. °CAUSES  °Chest pain can be caused by: °· Heartburn. °· Pneumonia or bronchitis. °· Anxiety or stress. °· Inflammation around your heart (pericarditis) or lung (pleuritis or pleurisy). °· A blood clot in your lung. °· A collapsed lung (pneumothorax). It can develop suddenly on its own (spontaneous pneumothorax) or from trauma to the chest. °· Shingles infection (varicella-zoster virus). °· Heart attack. °· Damage to the bones, muscles, and cartilage that make up your chest wall. This can include: °· Bruised bones due to injury. °· Strained muscles or cartilage due to frequent or repeated coughing or overwork. °· Fracture to one or more ribs. °· Sore cartilage due to inflammation (costochondritis). °RISK FACTORS  °Risk factors for chest pain may include: °· Activities that increase your risk for trauma or injury to your chest. °· Respiratory infections or conditions that cause frequent coughing. °· Medical conditions or overeating that can cause heartburn. °· Heart disease or family history of heart disease. °· Conditions or health behaviors that increase your risk of developing a blood clot. °· Having had chicken pox (varicella zoster). °SIGNS AND SYMPTOMS °Chest pain can feel like: °· Burning or tingling on the surface of your chest or deep in your chest. °· Crushing, pressure, aching, or squeezing pain. °· Dull or sharp pain that is worse when you move, cough, or take a deep breath. °· Pain that is also felt in your back, neck, shoulder, or arm, or pain that spreads to any of these areas. °Your chest pain may come and go, or it may stay  constant. °DIAGNOSIS °Lab tests or other studies may be needed to find the cause of your pain. Your health care provider may have you take a test called an ambulatory ECG (electrocardiogram). An ECG records your heartbeat patterns at the time the test is performed. You may also have other tests, such as: °· Transthoracic echocardiogram (TTE). During echocardiography, sound waves are used to create a picture of all of the heart structures and to look at how blood flows through your heart. °· Transesophageal echocardiogram (TEE). This is a more advanced imaging test that obtains images from inside your body. It allows your health care provider to see your heart in finer detail. °· Cardiac monitoring. This allows your health care provider to monitor your heart rate and rhythm in real time. °· Holter monitor. This is a portable device that records your heartbeat and can help to diagnose abnormal heartbeats. It allows your health care provider to track your heart activity for several days, if needed. °· Stress tests. These can be done through exercise or by taking medicine that makes your heart beat more quickly. °· Blood tests. °· Imaging tests. °TREATMENT  °Your treatment depends on what is causing your chest pain. Treatment may include: °· Medicines. These may include: °· Acid blockers for heartburn. °· Anti-inflammatory medicine. °· Pain medicine for inflammatory conditions. °· Antibiotic medicine, if an infection is present. °· Medicines to dissolve blood clots. °· Medicines to treat coronary artery disease. °· Supportive care for conditions that do not require medicines. This may include: °· Resting. °· Applying heat   or cold packs to injured areas. °· Limiting activities until pain decreases. °HOME CARE INSTRUCTIONS °· If you were prescribed an antibiotic medicine, finish it all even if you start to feel better. °· Avoid any activities that bring on chest pain. °· Do not use any tobacco products, including  cigarettes, chewing tobacco, or electronic cigarettes. If you need help quitting, ask your health care provider. °· Do not drink alcohol. °· Take medicines only as directed by your health care provider. °· Keep all follow-up visits as directed by your health care provider. This is important. This includes any further testing if your chest pain does not go away. °· If heartburn is the cause for your chest pain, you may be told to keep your head raised (elevated) while sleeping. This reduces the chance that acid will go from your stomach into your esophagus. °· Make lifestyle changes as directed by your health care provider. These may include: °· Getting regular exercise. Ask your health care provider to suggest some activities that are safe for you. °· Eating a heart-healthy diet. A registered dietitian can help you to learn healthy eating options. °· Maintaining a healthy weight. °· Managing diabetes, if necessary. °· Reducing stress. °SEEK MEDICAL CARE IF: °· Your chest pain does not go away after treatment. °· You have a rash with blisters on your chest. °· You have a fever. °SEEK IMMEDIATE MEDICAL CARE IF:  °· Your chest pain is worse. °· You have an increasing cough, or you cough up blood. °· You have severe abdominal pain. °· You have severe weakness. °· You faint. °· You have chills. °· You have sudden, unexplained chest discomfort. °· You have sudden, unexplained discomfort in your arms, back, neck, or jaw. °· You have shortness of breath at any time. °· You suddenly start to sweat, or your skin gets clammy. °· You feel nauseous or you vomit. °· You suddenly feel light-headed or dizzy. °· Your heart begins to beat quickly, or it feels like it is skipping beats. °These symptoms may represent a serious problem that is an emergency. Do not wait to see if the symptoms will go away. Get medical help right away. Call your local emergency services (911 in the [place] Do not drive yourself to the hospital. °  °This  information is not intended to replace advice given to you by your health care provider. Make sure you discuss any questions you have with your health care provider. °  °Document Released: 05/15/2005 Document Revised: 08/26/2014 Document Reviewed: 03/11/2014 °Elsevier Interactive Patient Education ©2016 [employer] ° °Panic Attacks °Panic attacks are sudden, short-lived surges of severe anxiety, fear, or discomfort. They may occur for no reason when you are relaxed, when you are anxious, or when you are sleeping. Panic attacks may occur for a number of reasons:  °· Healthy people occasionally have panic attacks in extreme, life-threatening situations, such as war or natural disasters. Normal anxiety is a protective mechanism of the body that helps us react to danger (fight or flight response). °· Panic attacks are often seen with anxiety disorders, such as panic disorder, social anxiety disorder, generalized anxiety disorder, and phobias. Anxiety disorders cause excessive or uncontrollable anxiety. They may interfere with your relationships or other life activities. °· Panic attacks are sometimes seen with other mental illnesses, such as depression and posttraumatic stress disorder. °· Certain medical conditions, prescription medicines, and drugs of abuse can cause panic attacks. °SYMPTOMS  °Panic attacks start suddenly, peak within 20 minutes, and are   accompanied by four or more of the following symptoms: °· Pounding heart or fast heart rate (palpitations). °· Sweating. °· Trembling or shaking. °· Shortness of breath or feeling smothered. °· Feeling choked. °· Chest pain or discomfort. °· Nausea or strange feeling in your stomach. °· Dizziness, light-headedness, or feeling like you will faint. °· Chills or hot flushes. °· Numbness or tingling in your lips or hands and feet. °· Feeling that things are not real or feeling that you are not yourself. °· Fear of losing control or going crazy. °· Fear of dying. °Some  of these symptoms can mimic serious medical conditions. For example, you may think you are having a heart attack. Although panic attacks can be very scary, they are not life threatening. °DIAGNOSIS  °Panic attacks are diagnosed through an assessment by your health care provider. Your health care provider will ask questions about your symptoms, such as where and when they occurred. Your health care provider will also ask about your medical history and use of alcohol and drugs, including prescription medicines. Your health care provider may order blood tests or other studies to rule out a serious medical condition. Your health care provider may refer you to a mental health professional for further evaluation. °TREATMENT  °· Most healthy people who have one or two panic attacks in an extreme, life-threatening situation will not require treatment. °· The treatment for panic attacks associated with anxiety disorders or other mental illness typically involves counseling with a mental health professional, medicine, or a combination of both. Your health care provider will help determine what treatment is best for you. °· Panic attacks due to physical illness usually go away with treatment of the illness. If prescription medicine is causing panic attacks, talk with your health care provider about stopping the medicine, decreasing the dose, or substituting another medicine. °· Panic attacks due to alcohol or drug abuse go away with abstinence. Some adults need professional help in order to stop drinking or using drugs. °HOME CARE INSTRUCTIONS  °· Take all medicines as directed by your health care provider.   °· Schedule and attend follow-up visits as directed by your health care provider. It is important to keep all your appointments. °SEEK MEDICAL CARE IF: °· You are not able to take your medicines as prescribed. °· Your symptoms do not improve or get worse. °SEEK IMMEDIATE MEDICAL CARE IF:  °· You experience panic attack  symptoms that are different than your usual symptoms. °· You have serious thoughts about hurting yourself or others. °· You are taking medicine for panic attacks and have a serious side effect. °MAKE SURE YOU: °· Understand these instructions. °· Will watch your condition. °· Will get help right away if you are not doing well or get worse. °  °This information is not intended to replace advice given to you by your health care provider. Make sure you discuss any questions you have with your health care provider. °  °Document Released: 08/05/2005 Document Revised: 08/10/2013 Document Reviewed: 03/19/2013 °Elsevier Interactive Patient Education ©2016 Elsevier Inc. ° °

## 2015-12-27 NOTE — ED Notes (Signed)
MD at bedside.  Dr. Mayford KnifeWilliams in room to reassess patient.  Patient is in no obvious distress at this time.

## 2016-01-30 ENCOUNTER — Ambulatory Visit (INDEPENDENT_AMBULATORY_CARE_PROVIDER_SITE_OTHER): Payer: PRIVATE HEALTH INSURANCE | Admitting: Urology

## 2016-01-30 ENCOUNTER — Encounter: Payer: Self-pay | Admitting: Urology

## 2016-01-30 VITALS — BP 117/81 | HR 80 | Ht 69.0 in | Wt 189.4 lb

## 2016-01-30 DIAGNOSIS — R300 Dysuria: Secondary | ICD-10-CM | POA: Diagnosis not present

## 2016-01-30 DIAGNOSIS — N410 Acute prostatitis: Secondary | ICD-10-CM | POA: Diagnosis not present

## 2016-01-30 LAB — MICROSCOPIC EXAMINATION: Bacteria, UA: NONE SEEN

## 2016-01-30 LAB — URINALYSIS, COMPLETE
BILIRUBIN UA: NEGATIVE
GLUCOSE, UA: NEGATIVE
Ketones, UA: NEGATIVE
NITRITE UA: NEGATIVE
Protein, UA: NEGATIVE
RBC UA: NEGATIVE
SPEC GRAV UA: 1.01 (ref 1.005–1.030)
Urobilinogen, Ur: 0.2 mg/dL (ref 0.2–1.0)
pH, UA: 6.5 (ref 5.0–7.5)

## 2016-01-30 MED ORDER — SULFAMETHOXAZOLE-TRIMETHOPRIM 800-160 MG PO TABS: 1.0000 | ORAL_TABLET | Freq: Two times a day (BID) | ORAL | Status: DC

## 2016-01-30 MED ORDER — IBUPROFEN 800 MG PO TABS: 800.0000 mg | ORAL_TABLET | Freq: Three times a day (TID) | ORAL | Status: DC

## 2016-01-30 NOTE — Progress Notes (Signed)
01/30/2016 11:17 AM   [name] 10/08/1975 [id number]  [facility] [address] [place], [place] [phone]  Chief Complaint  Patient presents with  . Follow-up    intermittent dysuria x 3weeks     HPI: John Dyer is a 40yo seen in consultation today for dysuria. [facility]  For the past 3 months he has had urgency, frequency, and dysuria. He has been treated 3 times for a UTI since that time and his symptoms improved but did not go away completely.  Dysuria is intermittent and he has associated lower back pain.  No exacerbating events. Drinking more water improves the dysuriaNO gross hematuria.  No hx of prostate infections. Has a hx of chlamydia 3-4 years ago that was treated. He had a recent STD check which was normal per patient.  He has occasional nocturia. He has a good stream.  UA today is normal except for occasional yeast.   PMH: Past Medical History  Diagnosis Date  . Heart murmur   . Depression   . GERD (gastroesophageal reflux disease)   . Anxiety   . Panic disorder   . Cervicalgia   . Fatigue   . Gastro-esophageal reflux disease without esophagitis 05/25/2015  . Anxiety, generalized 05/25/2015  . Awareness of heartbeats 05/08/2011  . Cardiac murmur 05/25/2015  . GAD (generalized anxiety disorder) 05/25/2015  . 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 No past surgeries      Home Medications:    Medication List       This list is accurate as of: 01/30/16 11:17 AM.  Always use your most recent med list.               timolol 0.5 % ophthalmic solution  Commonly known as:  TIMOPTIC        Allergies:  Allergies  Allergen Reactions  . Penicillin G Benzathine   . Prozac [Fluoxetine Hcl] Other (See Comments)    "jumpy"    Family History: Family History  Problem Relation Age of Onset   . Heart disease Father     murmur  . Stroke Father   . Cancer Maternal Grandmother     leukemia    Social History:  reports that he has never smoked. He has never used smokeless tobacco. He reports that he drinks alcohol. He reports that he does not use illicit drugs.  ROS: UROLOGY Frequent Urination?: No Hard to postpone urination?: No Burning/pain with urination?: Yes Get up at night to urinate?: No Leakage of urine?: No Urine stream starts and stops?: No Trouble starting stream?: No Do you have to strain to urinate?: No Blood in urine?: Yes Urinary tract infection?: Yes Sexually transmitted disease?: No Injury to kidneys or bladder?: No Painful intercourse?: No Weak stream?: No Erection problems?: No Penile pain?: No  Gastrointestinal Nausea?: No Vomiting?: No Indigestion/heartburn?: No Diarrhea?: No Constipation?: No  Constitutional Fever: No Night sweats?: No Weight loss?: No Fatigue?: No  Skin Skin rash/lesions?: No Itching?: Yes  Eyes Blurred vision?: No Double vision?: No  Ears/Nose/Throat Sore throat?: No Sinus problems?: No  Hematologic/Lymphatic Swollen glands?: No Easy bruising?: Yes  Cardiovascular Leg swelling?: No Chest pain?: No  Respiratory Cough?: No Shortness of breath?: No  Endocrine Excessive thirst?: No  Musculoskeletal Back pain?: Yes Joint pain?: Yes  Neurological Headaches?: Yes Dizziness?: Yes  Psychologic Depression?: No Anxiety?: Yes  Physical Exam: BP 117/81 mmHg  Pulse 80  Ht  (1.753 m)  Wt 85.911 kg (189 lb 6.4 oz)  BMI 27.96 kg/m2  Constitutional:  Alert and oriented, No acute distress. HEENT: [place] AT, moist mucus membranes.  Trachea midline, no masses. Cardiovascular: No clubbing, cyanosis, or edema. Respiratory: Normal respiratory effort, no increased work of breathing. GI: Abdomen is soft, nontender, nondistended, no abdominal masses GU: No CVA tenderness. Uncircumcised phallus, no  masses/lesions on penis, testes, scrotum. Prostate 20g and boggy, mildly tender.  Skin: No rashes, bruises or suspicious lesions. Lymph: No cervical or inguinal adenopathy. Neurologic: Grossly intact, no focal deficits, moving all 4 extremities. Psychiatric: Normal mood and affect.  Laboratory Data: Lab Results  Component Value Date   WBC 9.6 12/27/2015   HGB 16.3 12/27/2015   HCT 49.6 12/27/2015   MCV 86.6 12/27/2015   PLT 239 12/27/2015    Lab Results  Component Value Date   CREATININE 0.93 12/27/2015    No results found for: PSA  No results found for: TESTOSTERONE  No results found for: HGBA1C  Urinalysis    Component Value Date/Time   COLORURINE STRAW* 12/27/2015 1645   COLORURINE Yellow 02/02/2013 2259   APPEARANCEUR CLEAR* 12/27/2015 1645   APPEARANCEUR Clear 05/29/2015 1100   APPEARANCEUR Clear 02/02/2013 2259   LABSPEC 1.003* 12/27/2015 1645   LABSPEC 1.020 02/02/2013 2259   PHURINE 7.0 12/27/2015 1645   PHURINE 6.0 02/02/2013 2259   GLUCOSEU NEGATIVE 12/27/2015 1645   GLUCOSEU Negative 02/02/2013 2259   HGBUR NEGATIVE 12/27/2015 1645   HGBUR Negative 02/02/2013 2259   BILIRUBINUR NEGATIVE 12/27/2015 1645   BILIRUBINUR Negative 05/29/2015 1100   BILIRUBINUR Negative 02/02/2013 2259   KETONESUR NEGATIVE 12/27/2015 1645   KETONESUR Trace 02/02/2013 2259   PROTEINUR NEGATIVE 12/27/2015 1645   PROTEINUR 1+* 05/29/2015 1100   PROTEINUR Negative 02/02/2013 2259   NITRITE NEGATIVE 12/27/2015 1645   NITRITE Negative 05/29/2015 1100   NITRITE Negative 02/02/2013 2259   LEUKOCYTESUR NEGATIVE 12/27/2015 1645   LEUKOCYTESUR Negative 05/29/2015 1100   LEUKOCYTESUR Negative 02/02/2013 2259    Pertinent Imaging:   Assessment & Plan:   1. Dysuria/acute prostatitis -bactrim DS BID for 28 days - ibuprofen  TID for 5 days -RTC 1 month - Urinalysis, Complete   No Follow-up on file.  Wilkie Aye, MD  [facility] [address] [place], [place] [address] [phone]

## 2016-02-06 ENCOUNTER — Ambulatory Visit: Payer: No Typology Code available for payment source | Admitting: Urology

## 2016-02-27 ENCOUNTER — Ambulatory Visit (INDEPENDENT_AMBULATORY_CARE_PROVIDER_SITE_OTHER): Payer: PRIVATE HEALTH INSURANCE | Admitting: Urology

## 2016-02-27 ENCOUNTER — Encounter: Payer: Self-pay | Admitting: Urology

## 2016-02-27 VITALS — BP 123/86 | HR 65 | Ht 69.0 in | Wt 192.3 lb

## 2016-02-27 DIAGNOSIS — N410 Acute prostatitis: Secondary | ICD-10-CM

## 2016-02-27 DIAGNOSIS — R300 Dysuria: Secondary | ICD-10-CM

## 2016-02-27 LAB — URINALYSIS, COMPLETE
BILIRUBIN UA: NEGATIVE
Glucose, UA: NEGATIVE
Ketones, UA: NEGATIVE
LEUKOCYTES UA: NEGATIVE
Nitrite, UA: NEGATIVE
PH UA: 5.5 (ref 5.0–7.5)
Protein, UA: NEGATIVE
Specific Gravity, UA: 1.025 (ref 1.005–1.030)
UUROB: 0.2 mg/dL (ref 0.2–1.0)

## 2016-02-27 LAB — MICROSCOPIC EXAMINATION: BACTERIA UA: NONE SEEN

## 2016-02-27 MED ORDER — DIAZEPAM 5 MG PO TABS: 5.0000 mg | ORAL_TABLET | Freq: Every evening | ORAL | Status: AC | PRN

## 2016-02-27 MED ORDER — DOXYCYCLINE HYCLATE 50 MG PO CAPS: 50.0000 mg | ORAL_CAPSULE | Freq: Two times a day (BID) | ORAL | Status: DC

## 2016-02-27 MED ORDER — DIAZEPAM 5 MG PO TABS: 5.0000 mg | ORAL_TABLET | Freq: Every evening | ORAL | Status: DC | PRN

## 2016-02-27 NOTE — Progress Notes (Signed)
02/27/2016 9:22 AM   John Dyer 10-May-1976 [id number]  Referring provider: Randa Spike, DO [phone] [facility] [address] [facility] [place], [place] [address]  Chief Complaint  Patient presents with  . Follow-up    dysuria, acute prostatitis     HPI: [name] is a 39yo seen in followup for dysuria and pelvic pain. He was on bactrim DS for 28 days and his dysuria resolved but it started again 5 days ago. He has a constant burn in his penis and some mild sharp, nonraditiang right inguinal pain.  The motrin improved his testicular pain. He denies any other LUTS.    PMH: Past Medical History  Diagnosis Date  . Heart murmur   . Depression   . GERD (gastroesophageal reflux disease)   . Anxiety   . Panic disorder   . Cervicalgia   . Fatigue   . Gastro-esophageal reflux disease without esophagitis 05/25/2015  . Anxiety, generalized 05/25/2015  . Awareness of heartbeats 05/08/2011  . Cardiac murmur 05/25/2015  . GAD (generalized anxiety disorder) 05/25/2015  . 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 GERD (gastroesophageal reflux disease) 05/25/2015    Surgical History: Past Surgical History  Procedure Laterality Date  . No past surgeries      Home Medications:    Medication List       This list is accurate as of: 02/27/16  9:22 AM.  Always use your most recent med list.               sulfamethoxazole-trimethoprim 800-160 MG tablet  Commonly known as:  BACTRIM DS,SEPTRA DS  Take 1 tablet by mouth 2 (two) times daily.     timolol 0.5 % ophthalmic solution  Commonly known as:  TIMOPTIC        Allergies:  Allergies  Allergen Reactions  . Penicillin G Benzathine   . Prozac [Fluoxetine Hcl] Other (See Comments)    "jumpy"    Family History: Family History  Problem Relation Age of Onset  . Heart disease Father     murmur  . Stroke Father   . Cancer Maternal Grandmother     leukemia    Social History:  reports  that he has never smoked. He has never used smokeless tobacco. He reports that he drinks alcohol. He reports that he does not use illicit drugs.  ROS: UROLOGY Frequent Urination?: No Hard to postpone urination?: No Burning/pain with urination?: Yes Get up at night to urinate?: No Leakage of urine?: No Urine stream starts and stops?: No Trouble starting stream?: No Do you have to strain to urinate?: No Blood in urine?: No Urinary tract infection?: No Sexually transmitted disease?: No Injury to kidneys or bladder?: No Painful intercourse?: No Weak stream?: No Erection problems?: No Penile pain?: No  Gastrointestinal Nausea?: No Vomiting?: No Indigestion/heartburn?: No Diarrhea?: No Constipation?: No  Constitutional Fever: No Night sweats?: No Weight loss?: No  Skin Skin rash/lesions?: No Itching?: No  Eyes Blurred vision?: No Double vision?: No  Ears/Nose/Throat Sore throat?: No Sinus problems?: No  Hematologic/Lymphatic Swollen glands?: No Easy bruising?: No  Cardiovascular Leg swelling?: No Chest pain?: No  Respiratory Cough?: No Shortness of breath?: No  Endocrine Excessive thirst?: No  Musculoskeletal Back pain?: No Joint pain?: No  Neurological Headaches?: No Dizziness?: No  Psychologic Depression?: No Anxiety?: Yes  Physical Exam: BP 123/86 mmHg  Pulse 65  Ht  (1.753 m)  Wt 87.227 kg (192 lb 4.8 oz)  BMI  28.38 kg/m2  Constitutional:  Alert and oriented, No acute distress. HEENT: [place] AT, moist mucus membranes.  Trachea midline, no masses. Cardiovascular: No clubbing, cyanosis, or edema. Respiratory: Normal respiratory effort, no increased work of breathing. GI: Abdomen is soft, nontender, nondistended, no abdominal masses GU: No CVA tenderness.  Skin: No rashes, bruises or suspicious lesions. Lymph: No cervical or inguinal adenopathy. Neurologic: Grossly intact, no focal deficits, moving all 4 extremities. Psychiatric:  Normal mood and affect.  Laboratory Data: Lab Results  Component Value Date   WBC 9.6 12/27/2015   HGB 16.3 12/27/2015   HCT 49.6 12/27/2015   MCV 86.6 12/27/2015   PLT 239 12/27/2015    Lab Results  Component Value Date   CREATININE 0.93 12/27/2015    No results found for: PSA  No results found for: TESTOSTERONE  No results found for: HGBA1C  Urinalysis    Component Value Date/Time   COLORURINE STRAW* 12/27/2015 1645   COLORURINE Yellow 02/02/2013 2259   APPEARANCEUR Clear 01/30/2016 1057   APPEARANCEUR CLEAR* 12/27/2015 1645   APPEARANCEUR Clear 02/02/2013 2259   LABSPEC 1.003* 12/27/2015 1645   LABSPEC 1.020 02/02/2013 2259   PHURINE 7.0 12/27/2015 1645   PHURINE 6.0 02/02/2013 2259   GLUCOSEU Negative 01/30/2016 1057   GLUCOSEU Negative 02/02/2013 2259   HGBUR NEGATIVE 12/27/2015 1645   HGBUR Negative 02/02/2013 2259   BILIRUBINUR Negative 01/30/2016 1057   BILIRUBINUR NEGATIVE 12/27/2015 1645   BILIRUBINUR Negative 02/02/2013 2259   KETONESUR NEGATIVE 12/27/2015 1645   KETONESUR Trace 02/02/2013 2259   PROTEINUR Negative 01/30/2016 1057   PROTEINUR NEGATIVE 12/27/2015 1645   PROTEINUR Negative 02/02/2013 2259   NITRITE Negative 01/30/2016 1057   NITRITE NEGATIVE 12/27/2015 1645   NITRITE Negative 02/02/2013 2259   LEUKOCYTESUR Trace* 01/30/2016 1057   LEUKOCYTESUR NEGATIVE 12/27/2015 1645   LEUKOCYTESUR Negative 02/02/2013 2259    Pertinent Imaging: none  Assessment & Plan:    1. Dysuria -doxycycline 100mg  BID for 28 days - Urinalysis, Complete  2. Pelvic floor dysfunction -valium 5mg  qhs prn - Urinalysis, Complete   No Follow-up on file.  Wilkie AyePatrick Eldine Rencher, MD  [facility] [address], [address] [place], [place] [address] [phone]

## 2016-03-12 ENCOUNTER — Telehealth: Payer: Self-pay

## 2016-03-12 NOTE — Telephone Encounter (Signed)
Pt called stating he has been taking doxycycline for the past 12 days and today is having diarrhea. Reinforced with pt can take OTC imodium for diarrhea. Pt voiced understanding.

## 2016-03-26 ENCOUNTER — Encounter: Payer: Self-pay | Admitting: Urology

## 2016-03-26 ENCOUNTER — Ambulatory Visit (INDEPENDENT_AMBULATORY_CARE_PROVIDER_SITE_OTHER): Payer: PRIVATE HEALTH INSURANCE | Admitting: Urology

## 2016-03-26 VITALS — BP 113/73 | HR 69 | Ht 69.0 in | Wt 190.3 lb

## 2016-03-26 DIAGNOSIS — R102 Pelvic and perineal pain: Secondary | ICD-10-CM | POA: Diagnosis not present

## 2016-03-26 NOTE — Progress Notes (Signed)
03/26/2016 9:31 AM   John HerrlichJose Dyer 03/10/1976 [id number]  Referring provider: Randa SpikeKimberly G Lykins, DO [phone] [facility] [address] [facility] [place] [id number]  Chief Complaint  Patient presents with  . Follow-up    dysuria    HPI: Mr John Dyer is a 39yo here for followup for chronic prostatitis. He was placed on doxycycline and valium which resolved his pelvic pain and dysuria. He denies dysuria, frequency and urgency.     PMH: Past Medical History:  Diagnosis Date  . Anxiety   . Anxiety, generalized 05/25/2015  . Awareness of heartbeats 05/08/2011  . Cardiac murmur 05/25/2015  . Cervicalgia   . Depression   . Fatigue   . GAD (generalized anxiety disorder) 05/25/2015  . Gastro-esophageal reflux disease without esophagitis 05/25/2015  . GERD (gastroesophageal reflux disease)   . GERD (gastroesophageal reflux disease) 05/25/2015  . Heart murmur   . 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 Panic disorder     Surgical History: Past Surgical History:  Procedure Laterality Date  . NO PAST SURGERIES      Home Medications:    Medication List       Accurate as of 03/26/16  9:31 AM. Always use your most recent med list.          diazepam 5 MG tablet Commonly known as:  VALIUM Take 1 tablet (5 mg total) by mouth at bedtime as needed for anxiety.   doxycycline 50 MG capsule Commonly known as:  VIBRAMYCIN Take 1 capsule (50 mg total) by mouth 2 (two) times daily.   timolol 0.5 % ophthalmic solution Commonly known as:  TIMOPTIC       Allergies:  Allergies  Allergen Reactions  . Penicillin G Benzathine   . Prozac [Fluoxetine Hcl] Other (See Comments)    "jumpy"    Family History: Family History  Problem Relation Age of Onset  . Heart disease Father     murmur  . Stroke Father   . Cancer Maternal Grandmother     leukemia    Social History:  reports that he has never smoked. He has never used smokeless tobacco. He  reports that he drinks alcohol. He reports that he does not use drugs.  ROS: UROLOGY Frequent Urination?: No Hard to postpone urination?: No Burning/pain with urination?: No Get up at night to urinate?: No Leakage of urine?: No Urine stream starts and stops?: No Trouble starting stream?: No Do you have to strain to urinate?: No Blood in urine?: No Urinary tract infection?: No Sexually transmitted disease?: No Injury to kidneys or bladder?: No Painful intercourse?: No Weak stream?: No Erection problems?: No Penile pain?: No  Gastrointestinal Nausea?: No Vomiting?: No Indigestion/heartburn?: No Diarrhea?: No Constipation?: No  Constitutional Fever: No Night sweats?: No Weight loss?: No Fatigue?: No  Skin Skin rash/lesions?: No Itching?: No  Eyes Blurred vision?: No Double vision?: No  Ears/Nose/Throat Sore throat?: No Sinus problems?: No  Hematologic/Lymphatic Swollen glands?: No Easy bruising?: No  Cardiovascular Leg swelling?: No Chest pain?: No  Respiratory Cough?: No Shortness of breath?: No  Endocrine Excessive thirst?: No  Musculoskeletal Back pain?: No Joint pain?: No  Neurological Headaches?: No Dizziness?: No  Psychologic Depression?: No Anxiety?: No  Physical Exam: BP 113/73 (BP Location: Left Arm, Patient Position: Sitting, Cuff Size: Normal)   Pulse 69   Ht 5\' 9"  (1.753 m)   Wt 86.3 kg (190 lb 4.8 oz)   BMI 28.10 kg/m   Constitutional:  Alert and oriented, No acute distress. HEENT: [place] AT, moist mucus membranes.  Trachea midline, no masses. Cardiovascular: No clubbing, cyanosis, or edema. Respiratory: Normal respiratory effort, no increased work of breathing. GI: Abdomen is soft, nontender, nondistended, no abdominal masses GU: No CVA tenderness.  Skin: No rashes, bruises or suspicious lesions. Lymph: No cervical or inguinal adenopathy. Neurologic: Grossly intact, no focal deficits, moving all 4  extremities. Psychiatric: Normal mood and affect.  Laboratory Data: Lab Results  Component Value Date   WBC 9.6 12/27/2015   HGB 16.3 12/27/2015   HCT 49.6 12/27/2015   MCV 86.6 12/27/2015   PLT 239 12/27/2015    Lab Results  Component Value Date   CREATININE 0.93 12/27/2015    No results found for: PSA  No results found for: TESTOSTERONE  No results found for: HGBA1C  Urinalysis    Component Value Date/Time   COLORURINE STRAW (A) 12/27/2015 1645   APPEARANCEUR Clear 02/27/2016 0904   LABSPEC 1.003 (L) 12/27/2015 1645   LABSPEC 1.020 02/02/2013 2259   PHURINE 7.0 12/27/2015 1645   GLUCOSEU Negative 02/27/2016 0904   GLUCOSEU Negative 02/02/2013 2259   HGBUR NEGATIVE 12/27/2015 1645   BILIRUBINUR Negative 02/27/2016 0904   BILIRUBINUR Negative 02/02/2013 2259   KETONESUR NEGATIVE 12/27/2015 1645   PROTEINUR Negative 02/27/2016 0904   PROTEINUR NEGATIVE 12/27/2015 1645   NITRITE Negative 02/27/2016 0904   NITRITE NEGATIVE 12/27/2015 1645   LEUKOCYTESUR Negative 02/27/2016 0904   LEUKOCYTESUR Negative 02/02/2013 2259    Pertinent Imaging: none  Assessment & Plan:    1. CHronic prostatitis: -RTC 6 months  -no rxs given today  There are no diagnoses linked to this encounter.  No Follow-up on file.  Wilkie Aye, MD  [facility] [address] [place], [place] [address] [phone]

## 2016-03-31 DIAGNOSIS — R102 Pelvic and perineal pain: Secondary | ICD-10-CM | POA: Insufficient documentation

## 2016-09-26 ENCOUNTER — Encounter: Payer: Self-pay | Admitting: Urology

## 2016-09-26 ENCOUNTER — Ambulatory Visit: Payer: No Typology Code available for payment source | Admitting: Urology

## 2016-11-04 IMAGING — CR DG CHEST 2V
2 series · 2 of 2 positions shown · non-contrast
Comparison: 02/02/2013

CLINICAL DATA: Acute onset of mid chest pressure and shortness of
breath since last night.

EXAM:
CHEST  2 VIEW

[chest pa]
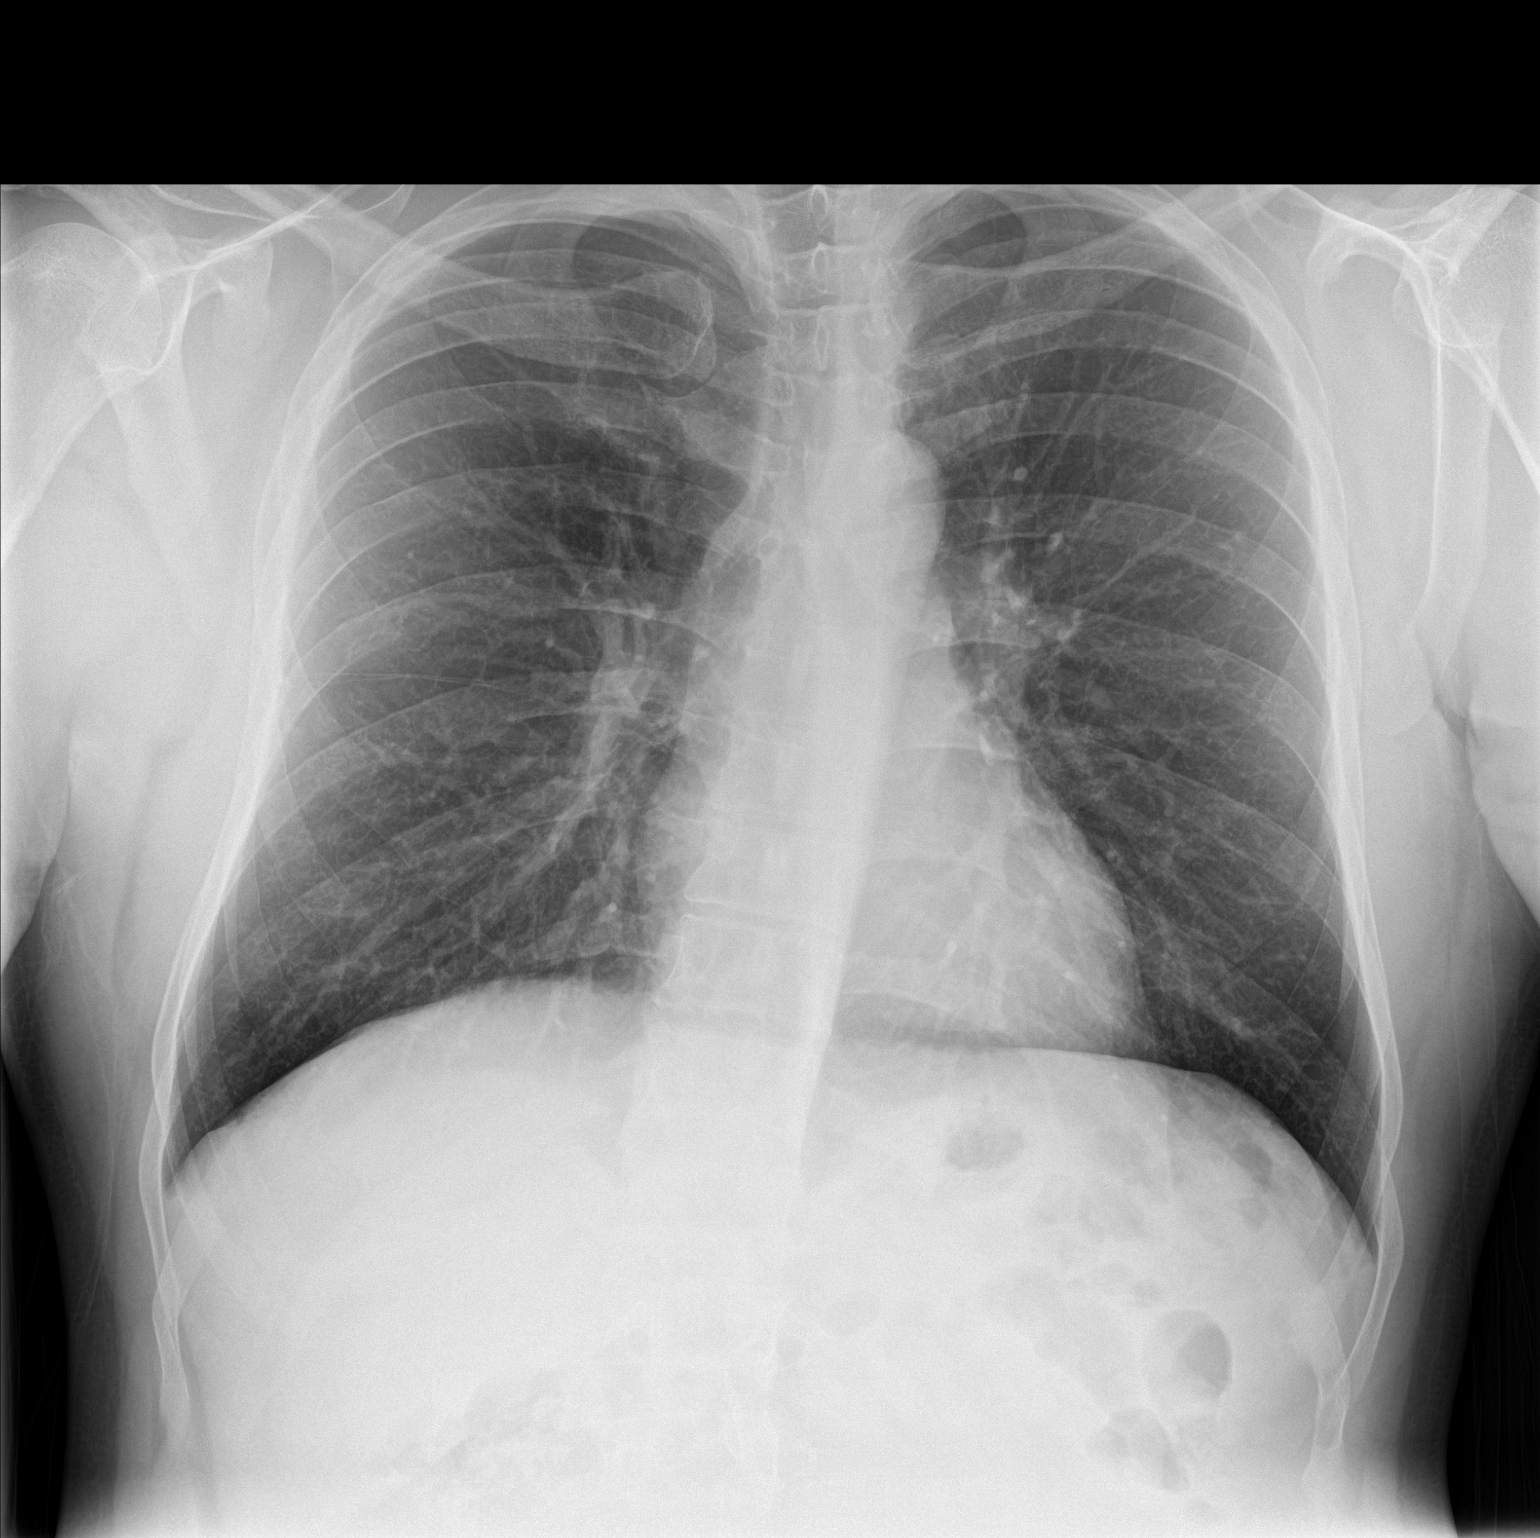

[chest lat]
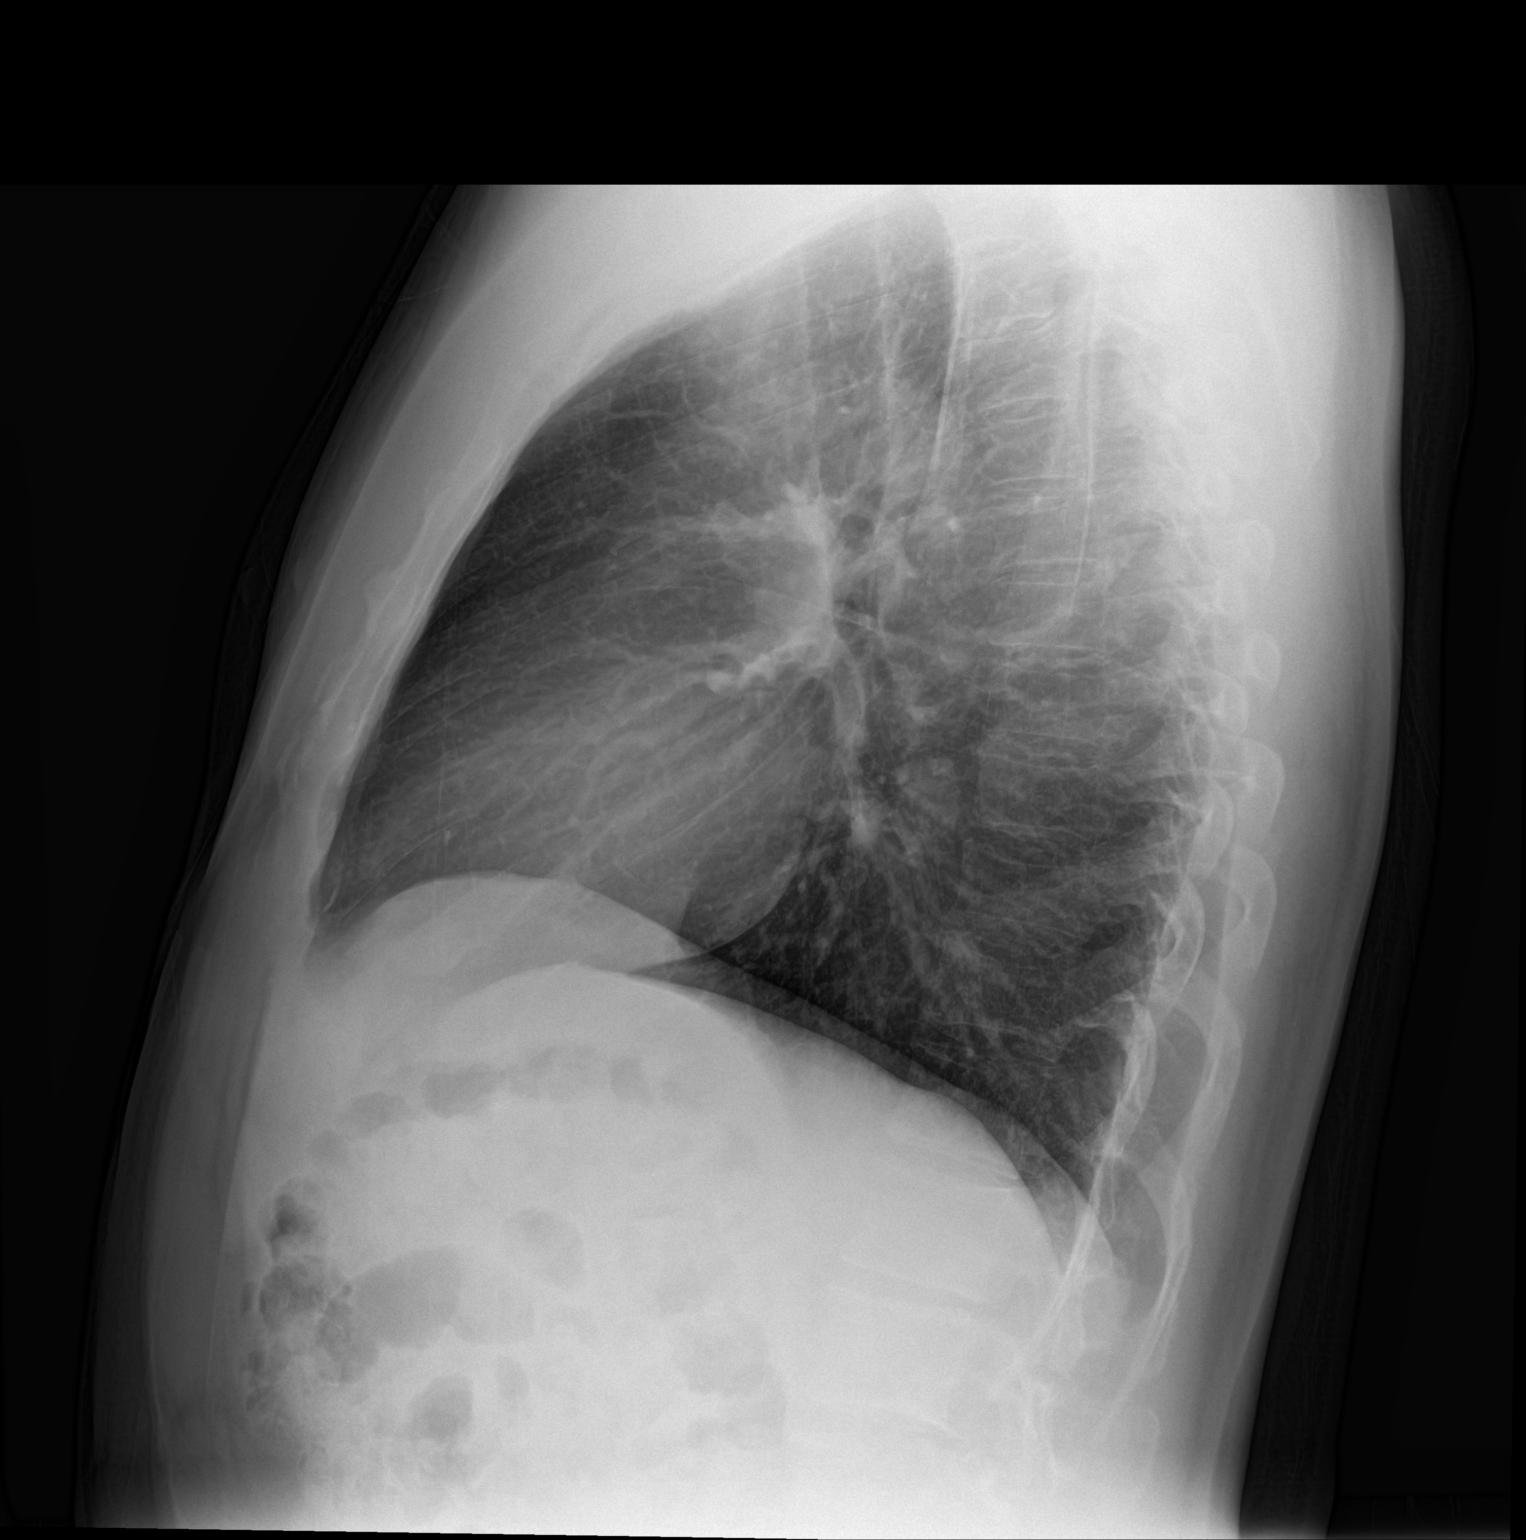

[2 of 2 positions shown; findings below may reference images not displayed]

FINDINGS: The cardiomediastinal contours are normal. The lungs are clear.
Pulmonary vasculature is normal. No consolidation, pleural effusion,
or pneumothorax. No acute osseous abnormalities are seen. There is
mild broad-based rightward curvature of the lower thoracolumbar
spine.
IMPRESSION: No acute pulmonary process.

## 2017-01-13 IMAGING — CR DG CHEST 2V
1 series · 2 of 2 positions shown · non-contrast
Comparison: 06/06/2015

CLINICAL DATA: Tachycardia up to 186 bpm, shortness of breath,
history panic attacks

EXAM:
CHEST  2 VIEW

[Series 1: dg chest 2 view · 0.14mm/px · 2 of 2 slices shown]
[im 1/2]
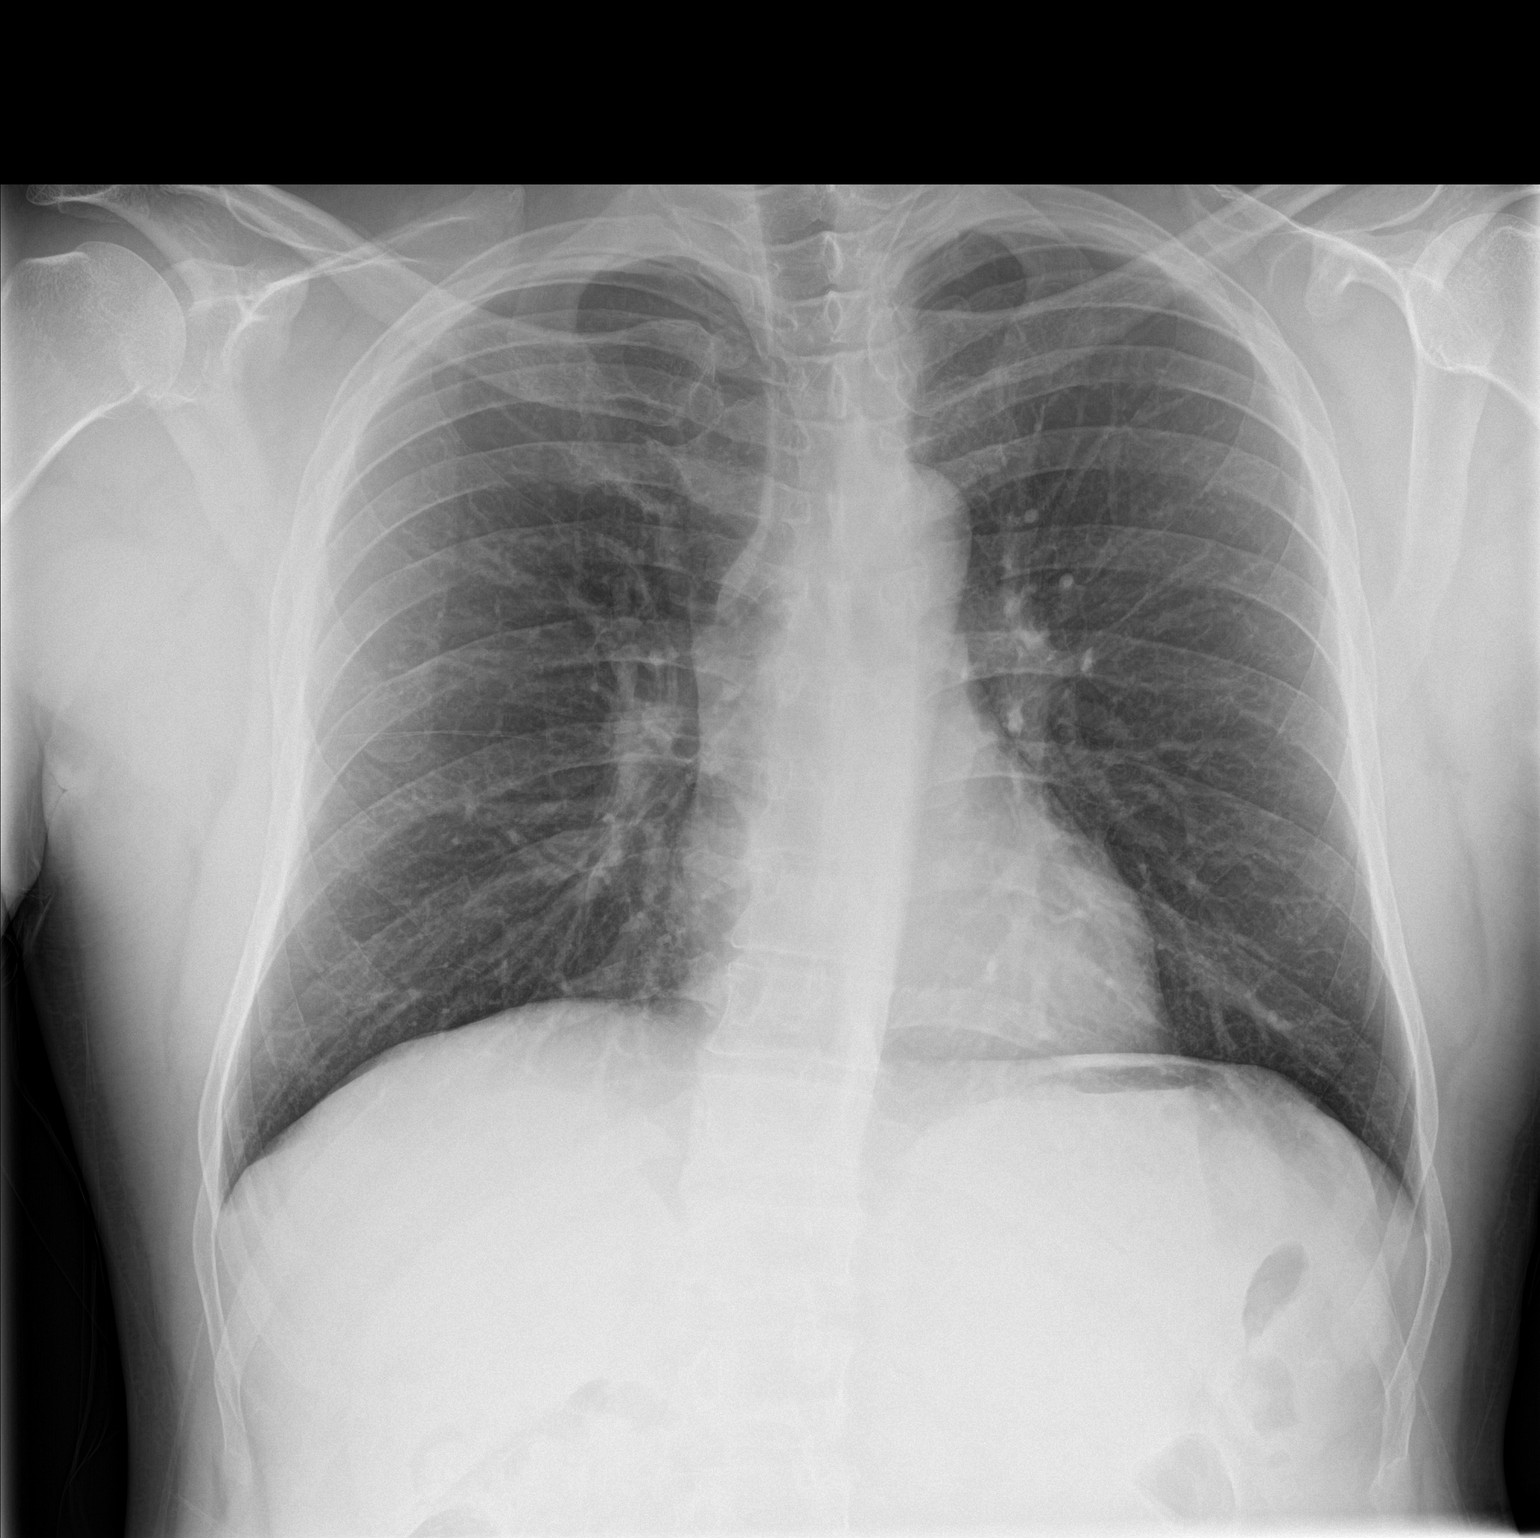
[im 2/2]
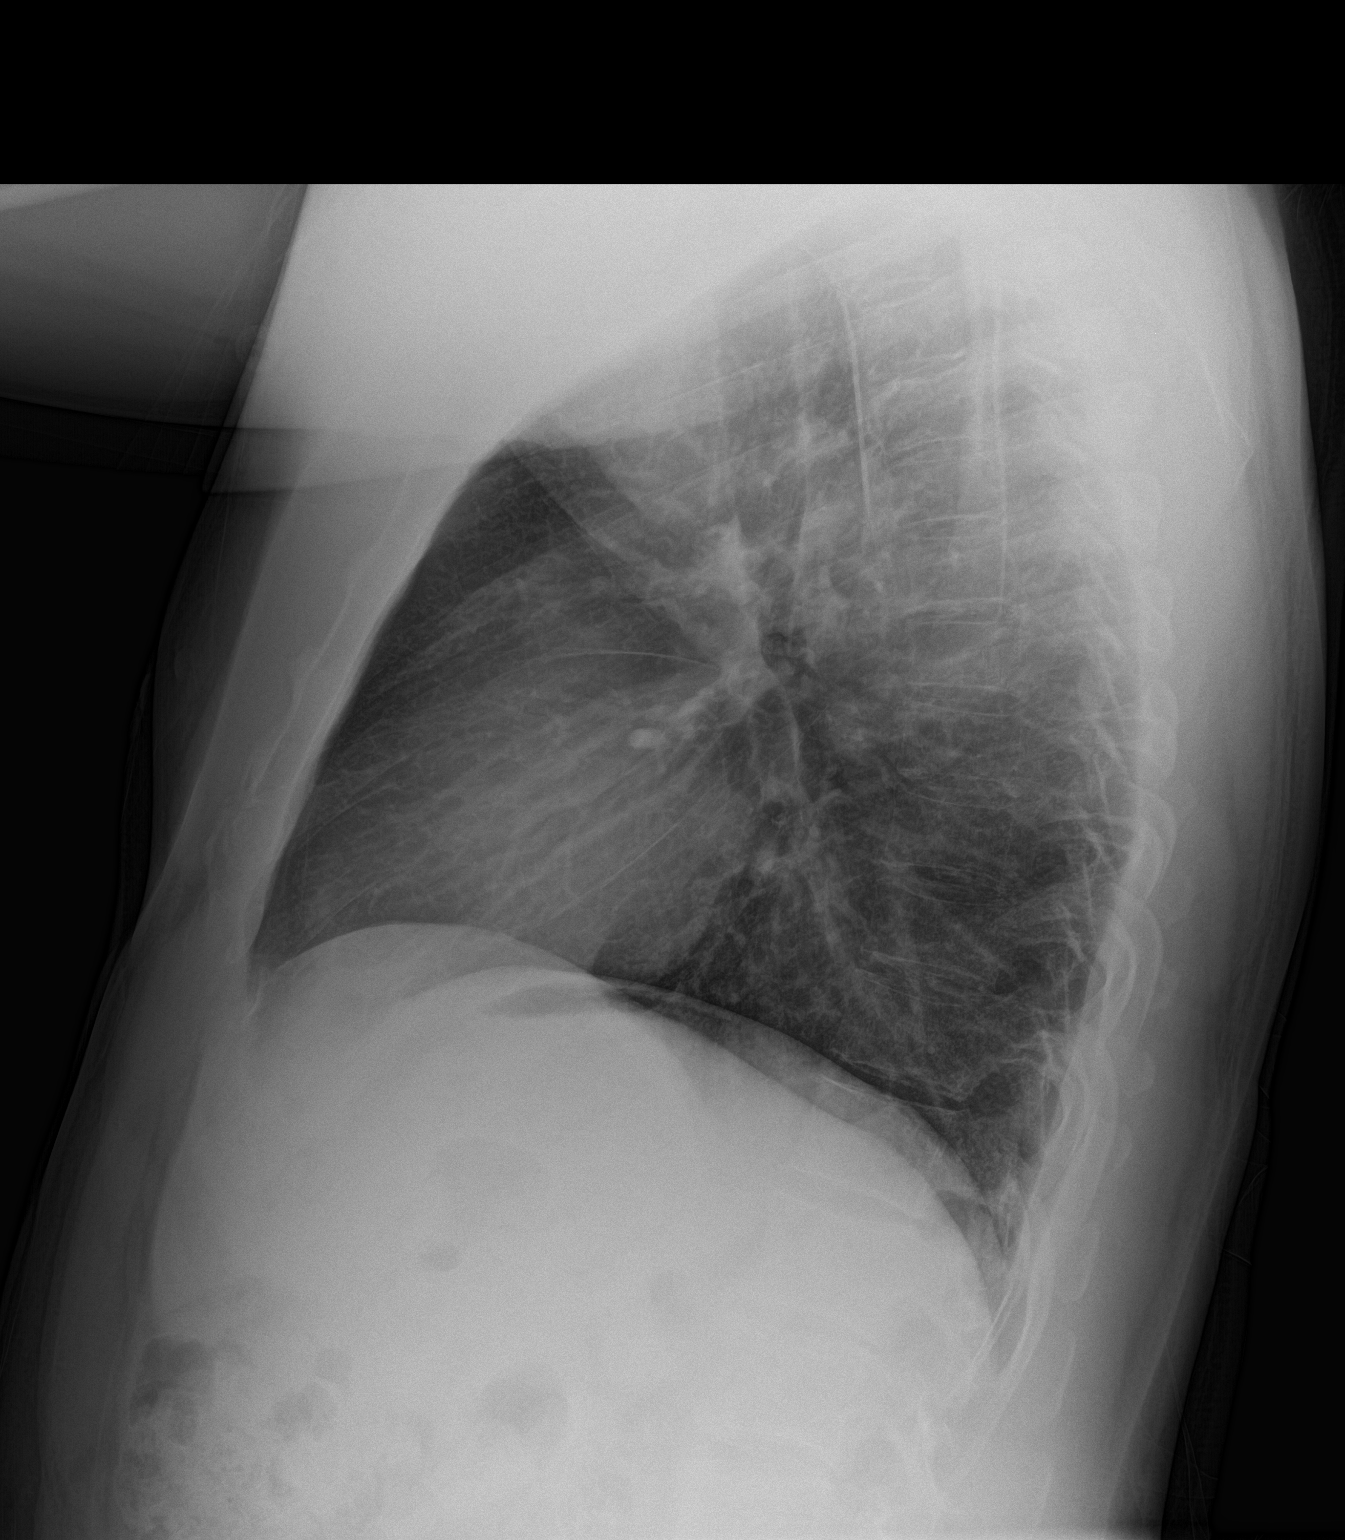

[2 of 2 positions shown; findings below may reference images not displayed]

FINDINGS: Normal heart size, mediastinal contours, and pulmonary vascularity.

Lungs clear.

No pneumothorax.

Mild broad-based levoconvex thoracic scoliosis.

Bones otherwise unremarkable.
IMPRESSION: No acute abnormalities.

## 2017-04-24 ENCOUNTER — Ambulatory Visit (INDEPENDENT_AMBULATORY_CARE_PROVIDER_SITE_OTHER): Payer: PRIVATE HEALTH INSURANCE | Admitting: Urology

## 2017-04-24 ENCOUNTER — Encounter: Payer: Self-pay | Admitting: Urology

## 2017-04-24 VITALS — BP 110/69 | HR 77 | Ht 69.0 in | Wt 189.1 lb

## 2017-04-24 DIAGNOSIS — N410 Acute prostatitis: Secondary | ICD-10-CM | POA: Diagnosis not present

## 2017-04-24 LAB — MICROSCOPIC EXAMINATION
RBC MICROSCOPIC, UA: NONE SEEN /HPF (ref 0–?)
WBC, UA: NONE SEEN /hpf (ref 0–?)

## 2017-04-24 LAB — URINALYSIS, COMPLETE
Bilirubin, UA: NEGATIVE
Glucose, UA: NEGATIVE
Ketones, UA: NEGATIVE
LEUKOCYTES UA: NEGATIVE
NITRITE UA: NEGATIVE
Protein, UA: NEGATIVE
RBC, UA: NEGATIVE
Specific Gravity, UA: 1.02 (ref 1.005–1.030)
Urobilinogen, Ur: 0.2 mg/dL (ref 0.2–1.0)
pH, UA: 7 (ref 5.0–7.5)

## 2017-04-24 MED ORDER — DOXYCYCLINE HYCLATE 100 MG PO CAPS: 100.0000 mg | ORAL_CAPSULE | Freq: Two times a day (BID) | ORAL | 0 refills | Status: DC

## 2017-04-24 NOTE — Progress Notes (Signed)
04/24/2017 10:37 AM   John Dyer [birth year] [id number]  Referring provider: Randa Spike, DO [phone] [facility] [address] [facility] [place], [place] [address]  Chief Complaint  Patient presents with  . Dysuria    HPI: The patient is a 41 year old gentleman with a past medical history of chronic prostatitis presents today for dysuria. He has been treated for chronic prostatitis in the past with doxycycline with good results. The symptoms have since returned. He has dysuria with urination. This is then becoming increasingly worse over the last 3 weeks. He has no pain outside of when he is voiding. Denies pain in his testicles of perineum. He states that this feels just like it did last time he was seen in our office. Also notes nocturia 1 over the last year. Other than this, no further urological complaints at this time. He is also requesting STD screening at this time.   PMH: Past Medical History:  Diagnosis Date  . Anxiety   . Anxiety, generalized 05/25/2015  . Awareness of heartbeats 05/08/2011  . Cardiac murmur 05/25/2015  . Cervicalgia   . Depression   . Fatigue   . GAD (generalized anxiety disorder) 05/25/2015  . Gastro-esophageal reflux disease without esophagitis 05/25/2015  . GERD (gastroesophageal reflux disease)   . GERD (gastroesophageal reflux disease) 05/25/2015  . Heart murmur   . Major depressive disorder with single episode 05/25/2015  . Panic disorder     Surgical History: Past Surgical History:  Procedure Laterality Date  . NO PAST SURGERIES      Home Medications:  Allergies as of 04/24/2017      Reactions   Penicillin G Benzathine    Prozac [fluoxetine Hcl] Other (See Comments)   "jumpy"      Medication List       Accurate as of 04/24/17 10:37 AM. Always use your most recent med list.          doxycycline 100 MG capsule Commonly known as:  VIBRAMYCIN Take 1 capsule (100 mg total) by mouth every 12 (twelve) hours.   timolol 0.5  % ophthalmic solution Commonly known as:  TIMOPTIC            Discharge Care Instructions        Start     Ordered   04/24/17 0000  Urinalysis, Complete     04/24/17 0945   04/24/17 0000  GC/Chlamydia Probe Amp     04/24/17 1000   04/24/17 0000  doxycycline (VIBRAMYCIN) 100 MG capsule  Every 12 hours     04/24/17 1035      Allergies:  Allergies  Allergen Reactions  . Penicillin G Benzathine   . Prozac [Fluoxetine Hcl] Other (See Comments)    "jumpy"    Family History: Family History  Problem Relation Age of Onset  . Heart disease Father        murmur  . Stroke Father   . Cancer Maternal Grandmother        leukemia    Social History:  reports that he has never smoked. He has never used smokeless tobacco. He reports that he drinks alcohol. He reports that he does not use drugs.  ROS: UROLOGY Frequent Urination?: Yes Hard to postpone urination?: No Burning/pain with urination?: Yes Get up at night to urinate?: No Leakage of urine?: No Urine stream starts and stops?: No Trouble starting stream?: No Do you have to strain to urinate?: No Blood in urine?: No Urinary tract infection?: No Sexually transmitted  disease?: No Injury to kidneys or bladder?: No Painful intercourse?: No Weak stream?: No Erection problems?: No Penile pain?: No  Gastrointestinal Nausea?: No Vomiting?: No Indigestion/heartburn?: No Diarrhea?: No Constipation?: No  Constitutional Fever: No Night sweats?: No Weight loss?: No Fatigue?: No  Skin Skin rash/lesions?: No Itching?: No  Eyes Blurred vision?: No Double vision?: No  Ears/Nose/Throat Sore throat?: No Sinus problems?: No  Hematologic/Lymphatic Swollen glands?: No Easy bruising?: No  Cardiovascular Leg swelling?: No Chest pain?: No  Respiratory Cough?: No Shortness of breath?: No  Endocrine Excessive thirst?: No  Musculoskeletal Back pain?: No Joint pain?: No  Neurological Headaches?:  No Dizziness?: No  Psychologic Depression?: No Anxiety?: Yes  Physical Exam: BP 110/69 (BP Location: Left Arm, Patient Position: Sitting, Cuff Size: Normal)   Pulse 77   Ht 5\' 9"  (1.753 m)   Wt 189 lb 1.6 oz (85.8 kg)   BMI 27.93 kg/m   Constitutional:  Alert and oriented, No acute distress. HEENT: [place] AT, moist mucus membranes.  Trachea midline, no masses. Cardiovascular: No clubbing, cyanosis, or edema. Respiratory: Normal respiratory effort, no increased work of breathing. GI: Abdomen is soft, nontender, nondistended, no abdominal masses GU: No CVA tenderness. DRE: 20 g, boggy throughout consistent with prostatitis. Skin: No rashes, bruises or suspicious lesions. Lymph: No cervical or inguinal adenopathy. Neurologic: Grossly intact, no focal deficits, moving all 4 extremities. Psychiatric: Normal mood and affect.  Laboratory Data: Lab Results  Component Value Date   WBC 9.6 12/27/2015   HGB 16.3 12/27/2015   HCT 49.6 12/27/2015   MCV 86.6 12/27/2015   PLT 239 12/27/2015    Lab Results  Component Value Date   CREATININE 0.93 12/27/2015    No results found for: PSA  No results found for: TESTOSTERONE  No results found for: HGBA1C  Urinalysis    Component Value Date/Time   COLORURINE STRAW (A) 12/27/2015 1645   APPEARANCEUR Clear 02/27/2016 0904   LABSPEC 1.003 (L) 12/27/2015 1645   LABSPEC 1.020 02/02/2013 2259   PHURINE 7.0 12/27/2015 1645   GLUCOSEU Negative 02/27/2016 0904   GLUCOSEU Negative 02/02/2013 2259   HGBUR NEGATIVE 12/27/2015 1645   BILIRUBINUR Negative 02/27/2016 0904   BILIRUBINUR Negative 02/02/2013 2259   KETONESUR NEGATIVE 12/27/2015 1645   PROTEINUR Negative 02/27/2016 0904   PROTEINUR NEGATIVE 12/27/2015 1645   NITRITE Negative 02/27/2016 0904   NITRITE NEGATIVE 12/27/2015 1645   LEUKOCYTESUR Negative 02/27/2016 0904   LEUKOCYTESUR Negative 02/02/2013 2259     Assessment & Plan:    1. Acute prostatitis -We'll start the  patient on a month of doxycycline twice a day -We'll send urine for culture, GC, chlamydia -Patient will follow up in 4 weeks to assess his symptoms. If the patient's symptoms resolved by that time, however he can call and cancel his appointment and follow-up when necessary.  Return in about 4 weeks (around 05/22/2017).  Hildred LaserBrian James Leonarda Leis, MD  [facility] [address], [address] [place], [place] [address] [phone]

## 2017-04-25 LAB — GC/CHLAMYDIA PROBE AMP
CHLAMYDIA, DNA PROBE: NEGATIVE
NEISSERIA GONORRHOEAE BY PCR: NEGATIVE

## 2017-04-28 ENCOUNTER — Telehealth: Payer: Self-pay | Admitting: Urology

## 2017-04-28 NOTE — Telephone Encounter (Signed)
Patient is calling asking for his lab results

## 2017-04-29 ENCOUNTER — Telehealth: Payer: Self-pay

## 2017-04-29 NOTE — Telephone Encounter (Signed)
Left pt mess/SW 

## 2017-04-29 NOTE — Telephone Encounter (Signed)
-----   Message from Hildred LaserBrian James Budzyn, MD sent at 04/25/2017  6:09 PM EDT ----- Please let patient know gc/chlamydia test negative. Thanks, bb   ----- Message ----- From: Skeet LatchWatkins, Chelsea C, LPN Sent: 1/6/10969/02/2017   2:57 PM To: Hildred LaserBrian James Budzyn, MD    ----- Message ----- From: Interface, [facility] Lab Results In Sent: 04/24/2017   1:37 PM To: Jennette [facility]

## 2017-05-29 ENCOUNTER — Ambulatory Visit (INDEPENDENT_AMBULATORY_CARE_PROVIDER_SITE_OTHER): Payer: PRIVATE HEALTH INSURANCE | Admitting: Urology

## 2017-05-29 VITALS — BP 120/75 | HR 67 | Wt 189.0 lb

## 2017-05-29 DIAGNOSIS — N410 Acute prostatitis: Secondary | ICD-10-CM | POA: Diagnosis not present

## 2017-05-29 LAB — URINALYSIS, COMPLETE
BILIRUBIN UA: NEGATIVE
Glucose, UA: NEGATIVE
Leukocytes, UA: NEGATIVE
NITRITE UA: NEGATIVE
PH UA: 5.5 (ref 5.0–7.5)
SPEC GRAV UA: 1.025 (ref 1.005–1.030)
UUROB: 0.2 mg/dL (ref 0.2–1.0)

## 2017-05-29 NOTE — Progress Notes (Signed)
05/29/2017 9:47 AM   John Dyer 12-Mar-1976 [id number]  Referring provider: Randa Spike, DO [phone] [facility] [address] [facility] [place], [place] [address]  No chief complaint on file.   HPI: The patient is a 40 year old gentleman with a past medical history of chronic prostatitis presents today for dysuria. He has been treated for chronic prostatitis in the past with doxycycline with good results. The symptoms have since returned. He has dysuria with urination. This is then becoming increasingly worse over the last 3 weeks. He has no pain outside of when he is voiding. Denies pain in his testicles of perineum. He states that this feels just like it did last time he was seen in our office. Also notes nocturia 1 over the last year. Other than this, no further urological complaints at this time. He is also requesting STD screening at this time.    He was diagnosed with acute prostatitis at his last visit for the above symptoms. He started on doxycycline for 1 month. He returns today four weeks later for follow-up. At this point, his symptoms have completely resolved. He has no dysuria or pain with urination. He feels like he is back to his baseline urinary status. He has no complaints at this time.   PMH: Past Medical History:  Diagnosis Date  . Anxiety   . Anxiety, generalized 05/25/2015  . Awareness of heartbeats 05/08/2011  . Cardiac murmur 05/25/2015  . Cervicalgia   . Depression   . Fatigue   . GAD (generalized anxiety disorder) 05/25/2015  . Gastro-esophageal reflux disease without esophagitis 05/25/2015  . GERD (gastroesophageal reflux disease)   . GERD (gastroesophageal reflux disease) 05/25/2015  . Heart murmur   . Major depressive disorder with single episode 05/25/2015  . Panic disorder     Surgical History: Past Surgical History:  Procedure Laterality Date  . NO PAST SURGERIES      Home Medications:  Allergies as of 05/29/2017      Reactions   Penicillin G Benzathine    Prozac [fluoxetine Hcl] Other (See Comments)   "jumpy"      Medication List       Accurate as of 05/29/17  9:47 AM. Always use your most recent med list.          timolol 0.5 % ophthalmic solution Commonly known as:  TIMOPTIC       Allergies:  Allergies  Allergen Reactions  . Penicillin G Benzathine   . Prozac [Fluoxetine Hcl] Other (See Comments)    "jumpy"    Family History: Family History  Problem Relation Age of Onset  . Heart disease Father        murmur  . Stroke Father   . Cancer Maternal Grandmother        leukemia    Social History:  reports that he has never smoked. He has never used smokeless tobacco. He reports that he drinks alcohol. He reports that he does not use drugs.  ROS: UROLOGY Frequent Urination?: No Hard to postpone urination?: No Burning/pain with urination?: No Get up at night to urinate?: No Leakage of urine?: No Urine stream starts and stops?: No Trouble starting stream?: No Do you have to strain to urinate?: No Blood in urine?: No Urinary tract infection?: No Sexually transmitted disease?: No Injury to kidneys or bladder?: No Painful intercourse?: No Weak stream?: No Erection problems?: No Penile pain?: No  Gastrointestinal Nausea?: No Vomiting?: No Indigestion/heartburn?: No Diarrhea?: No Constipation?: No  Constitutional Fever:  No Night sweats?: No Weight loss?: No Fatigue?: No  Skin Skin rash/lesions?: No Itching?: No  Eyes Blurred vision?: No Double vision?: No  Ears/Nose/Throat Sore throat?: No Sinus problems?: No  Hematologic/Lymphatic Swollen glands?: No Easy bruising?: No  Cardiovascular Leg swelling?: No Chest pain?: No  Respiratory Cough?: No Shortness of breath?: No  Endocrine Excessive thirst?: No  Musculoskeletal Back pain?: No Joint pain?: No  Neurological Headaches?: No Dizziness?: No  Psychologic Depression?: No Anxiety?: No  Physical  Exam: BP 120/75   Pulse 67   Wt 189 lb (85.7 kg)   BMI 27.91 kg/m   Constitutional:  Alert and oriented, No acute distress. HEENT: [place] AT, moist mucus membranes.  Trachea midline, no masses. Cardiovascular: No clubbing, cyanosis, or edema. Respiratory: Normal respiratory effort, no increased work of breathing. GI: Abdomen is soft, nontender, nondistended, no abdominal masses GU: No CVA tenderness.  Skin: No rashes, bruises or suspicious lesions. Lymph: No cervical or inguinal adenopathy. Neurologic: Grossly intact, no focal deficits, moving all 4 extremities. Psychiatric: Normal mood and affect.  Laboratory Data: Lab Results  Component Value Date   WBC 9.6 12/27/2015   HGB 16.3 12/27/2015   HCT 49.6 12/27/2015   MCV 86.6 12/27/2015   PLT 239 12/27/2015    Lab Results  Component Value Date   CREATININE 0.93 12/27/2015    No results found for: PSA  No results found for: TESTOSTERONE  No results found for: HGBA1C  Urinalysis    Component Value Date/Time   COLORURINE STRAW (A) 12/27/2015 1645   APPEARANCEUR Clear 04/24/2017 0958   LABSPEC 1.003 (L) 12/27/2015 1645   LABSPEC 1.020 02/02/2013 2259   PHURINE 7.0 12/27/2015 1645   GLUCOSEU Negative 04/24/2017 0958   GLUCOSEU Negative 02/02/2013 2259   HGBUR NEGATIVE 12/27/2015 1645   BILIRUBINUR Negative 04/24/2017 0958   BILIRUBINUR Negative 02/02/2013 2259   KETONESUR NEGATIVE 12/27/2015 1645   PROTEINUR Negative 04/24/2017 0958   PROTEINUR NEGATIVE 12/27/2015 1645   NITRITE Negative 04/24/2017 0958   NITRITE NEGATIVE 12/27/2015 1645   LEUKOCYTESUR Negative 04/24/2017 0958   LEUKOCYTESUR Negative 02/02/2013 2259     Assessment & Plan:   1. Acute prostatitis The patient's urinary symptoms have resolved with antibiotics. No further workup needed. The patient will follow-up as needed.  Return if symptoms worsen or fail to improve.  Hildred Laser, MD  [facility] [address] [place], [place] [address] [phone]

## 2018-08-28 ENCOUNTER — Ambulatory Visit
Admission: EM | Admit: 2018-08-28 | Discharge: 2018-08-28 | Disposition: A | Discharge status: Home / Self Care | Payer: BLUE CROSS/BLUE SHIELD | Attending: Family Medicine | Admitting: Family Medicine

## 2018-08-28 ENCOUNTER — Other Ambulatory Visit: Payer: Self-pay

## 2018-08-28 ENCOUNTER — Encounter: Payer: Self-pay | Admitting: Emergency Medicine

## 2018-08-28 DIAGNOSIS — R002 Palpitations: Secondary | ICD-10-CM | POA: Diagnosis not present

## 2018-08-28 NOTE — Discharge Instructions (Signed)
Finish the antibiotic.  Cough medicine as needed.  Stop the albuterol.  Take care  Dr. Adriana Simas

## 2018-08-28 NOTE — ED Provider Notes (Signed)
[facility]    CSN: [id number] Arrival date & time: 08/28/18  1154  History   Chief Complaint Chief Complaint  Patient presents with  . Medication Reaction   HPI  43 year old male presents with the above complaint.  Patient recently seen on 1/8.  He was prescribed azithromycin, Tessalon Perles, and albuterol.  Patient states that last night he developed palpitations.  He states that he has been feeling anxious.  He believes that this is from the albuterol.  He states that he took it several times repeatedly as he was unsure whether he did it correctly.  I believe this resulted in his adverse side effect.  He is currently feeling well.  He endorses compliance with the other medications.  Patient also notes associated fatigue.  No other associated symptoms.  No other complaints.  PMH, Surgical Hx, Family Hx, Social History reviewed and updated as below.  Past Medical History:  Diagnosis Date  . Anxiety   . Anxiety, generalized 05/25/2015  . Awareness of heartbeats 05/08/2011  . Cardiac murmur 05/25/2015  . Cervicalgia   . Depression   . Fatigue   . GAD (generalized anxiety disorder) 05/25/2015  . Gastro-esophageal reflux disease without esophagitis 05/25/2015  . GERD (gastroesophageal reflux disease)   . GERD (gastroesophageal reflux disease) 05/25/2015  . Heart murmur   . Major depressive disorder with single episode 05/25/2015  . Panic disorder     Patient Active Problem List   Diagnosis Date Noted  . Pelvic pain in male 03/31/2016  . Dysuria 01/30/2016  . Heart murmur 05/25/2015  . Depressive disorder 05/25/2015  . GERD (gastroesophageal reflux disease) 05/25/2015  . GAD (generalized anxiety disorder) 05/25/2015  . Cervicalgia 05/25/2015  . Cardiac murmur 05/25/2015  . Gastro-esophageal reflux disease without esophagitis 05/25/2015  . Anxiety, generalized 05/25/2015  . Major depressive disorder with single episode 05/25/2015  . Awareness of heartbeats  05/08/2011    Past Surgical History:  Procedure Laterality Date  . NO PAST SURGERIES      Home Medications    Prior to Admission medications   Medication Sig Start Date End Date Taking? Authorizing Provider  AZITHROMYCIN PO Take by mouth.   Yes [provider]  benzonatate (TESSALON) 100 MG capsule Take by mouth 3 (three) times daily as needed for cough.   Yes [provider]  timolol (TIMOPTIC) 0.5 % ophthalmic solution  06/30/13   [provider]    Family History Family History  Problem Relation Age of Onset  . Heart disease Father        murmur  . Stroke Father   . Cancer Maternal Grandmother        leukemia    Social History Social History   Tobacco Use  . Smoking status: Never Smoker  . Smokeless tobacco: Never Used  Substance Use Topics  . Alcohol use: Yes    Alcohol/week: 0.0 standard drinks  . Drug use: No     Allergies   Penicillin g benzathine and Prozac [fluoxetine hcl]   Review of Systems Review of Systems  Constitutional: Positive for fatigue.  Cardiovascular: Positive for palpitations.  Psychiatric/Behavioral: The patient is nervous/anxious.    Physical Exam Triage Vital Signs ED Triage Vitals  Enc Vitals Group     BP 08/28/18 1217 123/83     Pulse Rate 08/28/18 1217 70     Resp 08/28/18 1217 18     Temp 08/28/18 1217 98.2 F (36.8 C)     Temp Source 08/28/18  1217 Oral     SpO2 08/28/18 1217 99 %     Weight 08/28/18 1214 182 lb (82.6 kg)     Height 08/28/18 1214 5\' 9"  (1.753 m)     Head Circumference --      Peak Flow --      Pain Score 08/28/18 1214 0     Pain Loc --      Pain Edu? --      Excl. in GC? --    Updated Vital Signs BP 123/83 (BP Location: Left Arm)   Pulse 70   Temp 98.2 F (36.8 C) (Oral)   Resp 18   Ht 5\' 9"  (1.753 m)   Wt 82.6 kg   SpO2 99%   BMI 26.88 kg/m   Visual Acuity Right Eye Distance:   Left Eye Distance:   Bilateral Distance:    Right Eye Near:   Left Eye Near:     Bilateral Near:     Physical Exam Vitals signs and nursing note reviewed.  Constitutional:      General: He is not in acute distress. HENT:     Head: Normocephalic and atraumatic.  Eyes:     General:        Right eye: No discharge.        Left eye: No discharge.     Conjunctiva/sclera: Conjunctivae normal.  Cardiovascular:     Rate and Rhythm: Normal rate and regular rhythm.  Pulmonary:     Effort: Pulmonary effort is normal.     Breath sounds: No wheezing, rhonchi or rales.  Neurological:     Mental Status: He is alert.  Psychiatric:        Mood and Affect: Mood normal.        Behavior: Behavior normal.    UC Treatments / Results  Labs (all labs ordered are listed, but only abnormal results are displayed) Labs Reviewed - No data to display  EKG None  Radiology No results found.  Procedures Procedures (including critical care time)  Medications Ordered in UC Medications - No data to display  Initial Impression / Assessment and Plan / UC Course  I have reviewed the triage vital signs and the nursing notes.  Pertinent labs & imaging results that were available during my care of the patient were reviewed by me and considered in my medical decision making (see chart for details).    43 year old male presents with palpitations and anxiety.  I suspect that this is from inappropriate use of albuterol as well as underlying anxiety.  Advised him to finish his course of antibiotic.  Tessalon Perles as needed for cough.  Advised him to avoid use of albuterol unless he truly needed for shortness of breath.  Final Clinical Impressions(s) / UC Diagnoses   Final diagnoses:  Palpitations     Discharge Instructions     Finish the antibiotic.  Cough medicine as needed.  Stop the albuterol.  Take care  Dr. Adriana Simas    ED Prescriptions    None     Controlled Substance Prescriptions [place] Controlled Substance Registry consulted? Not Applicable   Tommie Sams,  DO 08/28/18 1402

## 2018-08-28 NOTE — ED Triage Notes (Addendum)
Patient states he was diagnosed with bronchitis and sinusitis on 01/04. Patient states he was given a zpak, albuterol inhaler and tessalon. He states he thinks the albuterol inhaler is making his heart race. Patient here to see if he needs to keep taking his medications or if he needs to stop his medications. Patient also reported that he didn't know how to use the albuterol inhaler so he used it several times in a row.

## 2019-11-25 ENCOUNTER — Ambulatory Visit: Payer: No Typology Code available for payment source | Attending: Internal Medicine

## 2019-11-25 DIAGNOSIS — Z23 Encounter for immunization: Secondary | ICD-10-CM

## 2019-11-25 NOTE — Progress Notes (Signed)
   Covid-19 Vaccination Clinic  Name:  John Dyer    MRN: [id number] DOB: 1976/05/12  11/25/2019  [name] was observed post Covid-19 immunization for 15 minutes without incident. He was provided with Vaccine Information Sheet and instruction to access the V-Safe system.   [name] was instructed to call 911 with any severe reactions post vaccine: Marland Kitchen Difficulty breathing  . Swelling of face and throat  . A fast heartbeat  . A bad rash all over body  . Dizziness and weakness   Immunizations Administered    Name Date Dose VIS Date Route   Pfizer COVID-19 Vaccine 11/25/2019  8:51 AM 0.3 mL 07/30/2019 Intramuscular   Manufacturer: [employer], [employer]   Lot: [id number]   NDC: [address]-6

## 2019-12-22 ENCOUNTER — Ambulatory Visit: Payer: No Typology Code available for payment source | Attending: Internal Medicine

## 2019-12-22 DIAGNOSIS — Z23 Encounter for immunization: Secondary | ICD-10-CM

## 2019-12-22 NOTE — Progress Notes (Signed)
   Covid-19 Vaccination Clinic  Name:  Domenic Schoenberger    MRN: [id number] DOB: [birth year]  12/22/2019  Mr. Soulier was observed post Covid-19 immunization for 15 minutes without incident. He was provided with Vaccine Information Sheet and instruction to access the V-Safe system.   Mr. Dyar was instructed to call 911 with any severe reactions post vaccine: Marland Kitchen Difficulty breathing  . Swelling of face and throat  . A fast heartbeat  . A bad rash all over body  . Dizziness and weakness   Immunizations Administered    Name Date Dose VIS Date Route   Pfizer COVID-19 Vaccine 12/22/2019  8:07 AM 0.3 mL 10/13/2018 Intramuscular   Manufacturer: [employer], [employer]   Lot: [id number]   NDC: [address]-9

## 2020-05-19 DEATH — deceased

## 2021-04-20 ENCOUNTER — Other Ambulatory Visit: Payer: Self-pay

## 2021-04-20 ENCOUNTER — Emergency Department
Admission: EM | Admit: 2021-04-20 | Discharge: 2021-04-20 | Disposition: A | Discharge status: Home / Self Care | Payer: No Typology Code available for payment source | Attending: Emergency Medicine | Admitting: Emergency Medicine

## 2021-04-20 DIAGNOSIS — F418 Other specified anxiety disorders: Secondary | ICD-10-CM

## 2021-04-20 DIAGNOSIS — U071 COVID-19: Secondary | ICD-10-CM | POA: Insufficient documentation

## 2021-04-20 DIAGNOSIS — F419 Anxiety disorder, unspecified: Secondary | ICD-10-CM | POA: Insufficient documentation

## 2021-04-20 LAB — BASIC METABOLIC PANEL
Anion gap: 7 (ref 5–15)
BUN: 7 mg/dL (ref 6–20)
CO2: 25 mmol/L (ref 22–32)
Calcium: 9.7 mg/dL (ref 8.9–10.3)
Chloride: 106 mmol/L (ref 98–111)
Creatinine, Ser: 0.89 mg/dL (ref 0.61–1.24)
GFR, Estimated: >60 mL/min (ref >60)
Glucose, Bld: 154 mg/dL — ABNORMAL HIGH (ref 70–99)
Potassium: 3.5 mmol/L (ref 3.5–5.1)
Sodium: 138 mmol/L (ref 135–145)

## 2021-04-20 LAB — CBC
HCT: 51 % (ref 39.0–52.0)
Hemoglobin: 17.6 g/dL — ABNORMAL HIGH (ref 13.0–17.0)
MCH: 28.7 pg (ref 26.0–34.0)
MCHC: 34.5 g/dL (ref 30.0–36.0)
MCV: 83.2 fL (ref 80.0–100.0)
Platelets: 251 10*3/uL (ref 150–400)
RBC: 6.13 MIL/uL — ABNORMAL HIGH (ref 4.22–5.81)
RDW: 13.2 % (ref 11.5–15.5)
WBC: 5.7 10*3/uL (ref 4.0–10.5)
nRBC: 0 % (ref 0.0–0.2)

## 2021-04-20 MED ORDER — NIRMATRELVIR/RITONAVIR (PAXLOVID)TABLET: 3.0000 | ORAL_TABLET | Freq: Two times a day (BID) | ORAL | 0 refills | Status: AC

## 2021-04-20 NOTE — ED Provider Notes (Signed)
[facility] Emergency Department Provider Note ____________________________________________   Event Date/Time   First MD Initiated Contact with Patient 04/20/21 1225     (approximate)  I have reviewed the triage vital signs and the nursing notes.   HISTORY  Chief Complaint Covid Positive  HPI John Dyer is a 45 y.o. male with history of anxiety, GERD and remaining history as listed below presents to the emergency department for treatment and evaluation of tachycardia.  Patient states that he began to feel ill a couple of days ago and decided to take a COVID test.  COVID test was positive.  This made him feel very anxious and his heart rat has been in the upper 90s.  He took one of his family members steroid tablets last night.  Otherwise, no alleviating measures attempted prior to arrival..         Past Medical History:  Diagnosis Date   Anxiety    Anxiety, generalized 05/25/2015   Awareness of heartbeats 05/08/2011   Cardiac murmur 05/25/2015   Cervicalgia    Depression    Fatigue    GAD (generalized anxiety disorder) 05/25/2015   Gastro-esophageal reflux disease without esophagitis 05/25/2015   GERD (gastroesophageal reflux disease)    GERD (gastroesophageal reflux disease) 05/25/2015   Heart murmur    Major depressive disorder with single episode 05/25/2015   Panic disorder     Patient Active Problem List   Diagnosis Date Noted   Pelvic pain in male 03/31/2016   Dysuria 01/30/2016   Heart murmur 05/25/2015   Depressive disorder 05/25/2015   GERD (gastroesophageal reflux disease) 05/25/2015   GAD (generalized anxiety disorder) 05/25/2015   Cervicalgia 05/25/2015   Cardiac murmur 05/25/2015   Gastro-esophageal reflux disease without esophagitis 05/25/2015   Anxiety, generalized 05/25/2015   Major depressive disorder with single episode 05/25/2015   Awareness of heartbeats 05/08/2011    Past Surgical History:  Procedure Laterality Date    NO PAST SURGERIES      Prior to Admission medications   Medication Sig Start Date End Date Taking? Authorizing Provider  nirmatrelvir/ritonavir EUA (PAXLOVID) 20 x 150 MG & 10 x 100MG  TABS Take 3 tablets by mouth 2 (two) times daily for 5 days. Patient GFR is >60. Take nirmatrelvir (150 mg) two tablets twice daily for 5 days and ritonavir (100 mg) one tablet twice daily for 5 days. 04/20/21 04/25/21 Yes Yuna Pizzolato B, FNP    Allergies Penicillin g benzathine and Prozac [fluoxetine hcl]  Family History  Problem Relation Age of Onset   Heart disease Father        murmur   Stroke Father    Cancer Maternal Grandmother        leukemia    Social History Social History   Tobacco Use   Smoking status: Never   Smokeless tobacco: Never  Substance Use Topics   Alcohol use: Yes    Alcohol/week: 0.0 standard drinks   Drug use: No    Review of Systems  Constitutional: No fever/chills Eyes: No visual changes. ENT: No sore throat. Cardiovascular: Denies chest pain. Positive for tachycardia. Respiratory: Denies shortness of breath. Positive for cough. Gastrointestinal: No abdominal pain.  No nausea, no vomiting.  No diarrhea.  No constipation. Genitourinary: Negative for dysuria. Musculoskeletal: Negative for back pain. Skin: Negative for rash. Neurological: Negative for headaches, focal weakness or numbness. ____________________________________________   PHYSICAL EXAM:  VITAL SIGNS: ED Triage Vitals  Enc Vitals Group     BP 04/20/21 1212 06/20/21)  127/92     Pulse Rate 04/20/21 1212 99     Resp 04/20/21 1212 18     Temp 04/20/21 1212 99 F (37.2 C)     Temp Source 04/20/21 1212 Oral     SpO2 04/20/21 1212 95 %     Weight 04/20/21 1214 185 lb (83.9 kg)     Height 04/20/21 1214 5\' 9"  (1.753 m)     Head Circumference --      Peak Flow --      Pain Score 04/20/21 1213 7     Pain Loc --      Pain Edu? --      Excl. in GC? --     Constitutional: Alert and oriented. Well  appearing and in no acute distress. Eyes: Conjunctivae are normal. Head: Atraumatic. Nose: No congestion/rhinnorhea. Mouth/Throat: Mucous membranes are moist. Oropharynx non-erythematous. Neck: No stridor.   Hematological/Lymphatic/Immunilogical: No cervical lymphadenopathy. Cardiovascular: Normal rate, regular rhythm. Grossly normal heart sounds. Good peripheral circulation. Respiratory: Normal respiratory effort.  No retractions. Lungs CTAB. Gastrointestinal: Soft and nontender. No distention. No abdominal bruits. No CVA tenderness. Genitourinary:  Musculoskeletal: No lower extremity tenderness nor edema.  No joint effusions. Neurologic:  Normal speech and language. No gross focal neurologic deficits are appreciated. No gait instability. Skin:  Skin is warm, dry and intact. No rash noted. Psychiatric: Mood and affect are normal. Speech and behavior are normal.  ____________________________________________   LABS (all labs ordered are listed, but only abnormal results are displayed)  Labs Reviewed  CBC - Abnormal; Notable for the following components:      Result Value   RBC 6.13 (*)    Hemoglobin 17.6 (*)    All other components within normal limits  BASIC METABOLIC PANEL - Abnormal; Notable for the following components:   Glucose, Bld 154 (*)    All other components within normal limits   ____________________________________________  EKG  ED ECG REPORT I, Keeley Sussman, FNP-BC personally viewed and interpreted this ECG.   Date: 04/20/2021  EKG Time: 1217  Rate: 96  Rhythm: normal EKG, normal sinus rhythm  Axis: rightward  Intervals:none  ST&T Change: no ST elevation  ____________________________________________  RADIOLOGY  ED MD interpretation:    Not indicated.  I, 1218, personally viewed and evaluated these images (plain radiographs) as part of my medical decision making, as well as reviewing the written report by the radiologist.  Official  radiology report(s): No results found.  ____________________________________________   PROCEDURES  Procedure(s) performed (including Critical Care):  Procedures  ____________________________________________   INITIAL IMPRESSION / ASSESSMENT AND PLAN     45 year old male presenting to the emergency department for treatment and evaluation of higher heart rate than usual.  He does have a history of anxiety and states that he feels that this is the underlying cause.  Exam, labs, and EKG are reassuring. Symptoms likely a combination of COVID, anxiety, and taking the dose of steroid last night. He feels reassured.  He was encouraged to take Tylenol or ibuprofen if he develops body aches, headache, or fever.  He was advised to follow-up with his primary care provider or return to the emergency department for symptoms of concern.      ___________________________________________   FINAL CLINICAL IMPRESSION(S) / ED DIAGNOSES  Final diagnoses:  COVID  Anxiety about health     ED Discharge Orders          Ordered    nirmatrelvir/ritonavir EUA (PAXLOVID) 20 x 150 MG & 10  x 100MG  TABS  2 times daily        04/20/21 1242             John Dyer was evaluated in Emergency Department on 04/20/2021 for the symptoms described in the history of present illness. He was evaluated in the context of the global COVID-19 pandemic, which necessitated consideration that the patient might be at risk for infection with the SARS-CoV-2 virus that causes COVID-19. Institutional protocols and algorithms that pertain to the evaluation of patients at risk for COVID-19 are in a state of rapid change based on information released by regulatory bodies including the CDC and federal and state organizations. These policies and algorithms were followed during the patient's care in the ED.   Note:  This document was prepared using Dragon voice recognition software and may include unintentional dictation  errors.    06/20/2021, FNP 04/20/21 1613    06/20/21, MD 04/20/21 1728

## 2021-04-20 NOTE — ED Triage Notes (Signed)
Pt here after testing covid positive yesterday with a at home test. Pt thinks his anxiety caused his heart rate to jump up. Pt states that he is having muscle weakness and congestion.

## 2023-01-07 ENCOUNTER — Ambulatory Visit
Admission: EM | Admit: 2023-01-07 | Discharge: 2023-01-07 | Disposition: A | Discharge status: Home / Self Care | Payer: Self-pay

## 2023-01-07 DIAGNOSIS — R1012 Left upper quadrant pain: Secondary | ICD-10-CM | POA: Insufficient documentation

## 2023-01-07 DIAGNOSIS — K219 Gastro-esophageal reflux disease without esophagitis: Secondary | ICD-10-CM | POA: Insufficient documentation

## 2023-01-07 DIAGNOSIS — R110 Nausea: Secondary | ICD-10-CM | POA: Insufficient documentation

## 2023-01-07 DIAGNOSIS — R079 Chest pain, unspecified: Secondary | ICD-10-CM

## 2023-01-07 DIAGNOSIS — K2900 Acute gastritis without bleeding: Secondary | ICD-10-CM | POA: Insufficient documentation

## 2023-01-07 DIAGNOSIS — R9431 Abnormal electrocardiogram [ECG] [EKG]: Secondary | ICD-10-CM | POA: Insufficient documentation

## 2023-01-07 LAB — CBC WITH DIFFERENTIAL/PLATELET
Abs Immature Granulocytes: 0.01 10*3/uL (ref 0.00–0.07)
Basophils Absolute: 0 10*3/uL (ref 0.0–0.1)
Basophils Relative: 1 %
Eosinophils Absolute: 0.4 10*3/uL (ref 0.0–0.5)
Eosinophils Relative: 6 %
HCT: 48.1 % (ref 39.0–52.0)
Hemoglobin: 16.8 g/dL (ref 13.0–17.0)
Immature Granulocytes: 0 %
Lymphocytes Relative: 37 %
Lymphs Abs: 2.3 10*3/uL (ref 0.7–4.0)
MCH: 29.5 pg (ref 26.0–34.0)
MCHC: 34.9 g/dL (ref 30.0–36.0)
MCV: 84.5 fL (ref 80.0–100.0)
Monocytes Absolute: 0.5 10*3/uL (ref 0.1–1.0)
Monocytes Relative: 8 %
Neutro Abs: 2.9 10*3/uL (ref 1.7–7.7)
Neutrophils Relative %: 48 %
Platelets: 218 10*3/uL (ref 150–400)
RBC: 5.69 MIL/uL (ref 4.22–5.81)
RDW: 13.9 % (ref 11.5–15.5)
WBC: 6.1 10*3/uL (ref 4.0–10.5)
nRBC: 0 % (ref 0.0–0.2)

## 2023-01-07 LAB — URINALYSIS, W/ REFLEX TO CULTURE (INFECTION SUSPECTED)
Bacteria, UA: NONE SEEN
Bilirubin Urine: NEGATIVE
Glucose, UA: NEGATIVE mg/dL
Ketones, ur: NEGATIVE mg/dL
Leukocytes,Ua: NEGATIVE
Nitrite: NEGATIVE
Protein, ur: NEGATIVE mg/dL
Specific Gravity, Urine: 1.015 (ref 1.005–1.030)
WBC, UA: NONE SEEN WBC/hpf (ref 0–5)
pH: 6 (ref 5.0–8.0)

## 2023-01-07 LAB — COMPREHENSIVE METABOLIC PANEL
ALT: 38 U/L (ref 0–44)
AST: 35 U/L (ref 15–41)
Albumin: 4.5 g/dL (ref 3.5–5.0)
Alkaline Phosphatase: 74 U/L (ref 38–126)
Anion gap: 9 (ref 5–15)
BUN: 12 mg/dL (ref 6–20)
CO2: 28 mmol/L (ref 22–32)
Calcium: 9.7 mg/dL (ref 8.9–10.3)
Chloride: 101 mmol/L (ref 98–111)
Creatinine, Ser: 0.92 mg/dL (ref 0.61–1.24)
GFR, Estimated: >60 mL/min (ref >60)
Glucose, Bld: 85 mg/dL (ref 70–99)
Potassium: 4.6 mmol/L (ref 3.5–5.1)
Sodium: 138 mmol/L (ref 135–145)
Total Bilirubin: 0.8 mg/dL (ref 0.3–1.2)
Total Protein: 7.6 g/dL (ref 6.5–8.1)

## 2023-01-07 LAB — TROPONIN I (HIGH SENSITIVITY): Troponin I (High Sensitivity): 2 ng/L (ref <18)

## 2023-01-07 LAB — LIPASE, BLOOD: Lipase: 55 U/L — ABNORMAL HIGH (ref 11–51)

## 2023-01-07 MED ORDER — PANTOPRAZOLE SODIUM 20 MG PO TBEC: 20.0000 mg | DELAYED_RELEASE_TABLET | Freq: Every day | ORAL | 1 refills | Status: AC

## 2023-01-07 MED ORDER — LIDOCAINE VISCOUS HCL 2 % MT SOLN
15.0000 mL | Freq: Once | OROMUCOSAL | Status: AC
  Administered 2023-01-07: 15 mL via OROMUCOSAL

## 2023-01-07 MED ORDER — ALUM & MAG HYDROXIDE-SIMETH 200-200-20 MG/5ML PO SUSP
30.0000 mL | Freq: Once | ORAL | Status: AC
  Administered 2023-01-07: 30 mL via ORAL

## 2023-01-07 NOTE — ED Provider Notes (Signed)
[facility]    CSN: [id number] Arrival date & time: 01/07/23  0820      History   Chief Complaint Chief Complaint  Patient presents with   Abdominal Pain    HPI John Dyer is a 47 y.o. male presenting for 3-day history of left upper quadrant abdominal aching pain which occasionally radiates to the left back.  He reports having chills, nausea and bodyaches at the onset but that has since resolved.  Pain has not worsened.  He says the pain is constant but worse when he eats spicy or greasy foods or has alcohol.  He reports that he had alcohol a couple days before onset of symptoms but says he only drinks beer when he drinks which is rare.  He denies any associated pain in the chest, palpitations, shortness of breath.  He has not been sick and denies fever, cough or congestion.  No vomiting, diarrhea or constipation.  Denies flank pain, hematuria, dysuria.  Patient does have a history of anxiety and acid reflux.  He says he takes probiotics but he does not take any medication for GERD currently.  No history of heart disease.  HPI  Past Medical History:  Diagnosis Date   Anxiety    Anxiety, generalized 05/25/2015   Awareness of heartbeats 05/08/2011   Cardiac murmur 05/25/2015   Cervicalgia    Depression    Fatigue    GAD (generalized anxiety disorder) 05/25/2015   Gastro-esophageal reflux disease without esophagitis 05/25/2015   GERD (gastroesophageal reflux disease)    GERD (gastroesophageal reflux disease) 05/25/2015   Heart murmur    Major depressive disorder with single episode 05/25/2015   Panic disorder     Patient Active Problem List   Diagnosis Date Noted   Pelvic pain in male 03/31/2016   Dysuria 01/30/2016   Heart murmur 05/25/2015   Depressive disorder 05/25/2015   GERD (gastroesophageal reflux disease) 05/25/2015   GAD (generalized anxiety disorder) 05/25/2015   Cervicalgia 05/25/2015   Cardiac murmur 05/25/2015   Gastro-esophageal reflux disease  without esophagitis 05/25/2015   Anxiety, generalized 05/25/2015   Major depressive disorder with single episode 05/25/2015   Awareness of heartbeats 05/08/2011    Past Surgical History:  Procedure Laterality Date   NO PAST SURGERIES         Home Medications    Prior to Admission medications   Medication Sig Start Date End Date Taking? Authorizing Provider  clobetasol cream (TEMOVATE) 0.05 % Apply 1 Application topically 2 (two) times daily. 09/23/22  Yes [provider]  pantoprazole (PROTONIX) 20 MG tablet Take 1 tablet (20 mg total) by mouth daily for 14 days. 01/07/23 01/21/23 Yes Shirlee Latch, PA-C    Family History Family History  Problem Relation Age of Onset   Heart disease Father        murmur   Stroke Father    Cancer Maternal Grandmother        leukemia    Social History Social History   Tobacco Use   Smoking status: Never   Smokeless tobacco: Never  Substance Use Topics   Alcohol use: Yes    Alcohol/week: 0.0 standard drinks of alcohol   Drug use: No     Allergies   Penicillin g benzathine and Prozac [fluoxetine hcl]   Review of Systems Review of Systems  Constitutional:  Negative for appetite change, fatigue and fever.  Respiratory:  Negative for cough and shortness of breath.   Cardiovascular:  Negative for chest pain.  Gastrointestinal:  Positive for abdominal pain. Negative for constipation, diarrhea, nausea and vomiting.  Genitourinary:  Negative for difficulty urinating, dysuria and flank pain.  Neurological:  Negative for weakness.     Physical Exam Triage Vital Signs ED Triage Vitals  Enc Vitals Group     BP 01/07/23 0856 (!) 127/96     Pulse Rate 01/07/23 0856 69     Resp 01/07/23 0856 18     Temp 01/07/23 0856 98.1 F (36.7 C)     Temp Source 01/07/23 0856 Oral     SpO2 01/07/23 0856 100 %     Weight --      Height --      Head Circumference --      Peak Flow --      Pain Score 01/07/23 0857 3     Pain Loc --       Pain Edu? --      Excl. in GC? --    No data found.  Updated Vital Signs BP (!) 127/96 (BP Location: Right Arm)   Pulse 69   Temp 98.1 F (36.7 C) (Oral)   Resp 18   SpO2 100%     Physical Exam Vitals and nursing note reviewed.  Constitutional:      General: He is not in acute distress.    Appearance: Normal appearance. He is well-developed. He is not ill-appearing.  HENT:     Head: Normocephalic and atraumatic.     Nose: Nose normal.     Mouth/Throat:     Mouth: Mucous membranes are moist.     Pharynx: Oropharynx is clear.  Eyes:     General: No scleral icterus.    Conjunctiva/sclera: Conjunctivae normal.  Cardiovascular:     Rate and Rhythm: Normal rate and regular rhythm.     Heart sounds: Normal heart sounds.  Pulmonary:     Effort: Pulmonary effort is normal. No respiratory distress.     Breath sounds: Normal breath sounds.  Abdominal:     Palpations: Abdomen is soft.     Tenderness: There is abdominal tenderness (LUQ). There is no right CVA tenderness, left CVA tenderness, guarding or rebound.  Musculoskeletal:     Cervical back: Neck supple.  Skin:    General: Skin is warm and dry.     Capillary Refill: Capillary refill takes less than 2 seconds.  Neurological:     General: No focal deficit present.     Mental Status: He is alert. Mental status is at baseline.     Motor: No weakness.     Gait: Gait normal.  Psychiatric:        Mood and Affect: Mood normal.        Behavior: Behavior normal.      UC Treatments / Results  Labs (all labs ordered are listed, but only abnormal results are displayed) Labs Reviewed  URINALYSIS, W/ REFLEX TO CULTURE (INFECTION SUSPECTED) - Abnormal; Notable for the following components:      Result Value   Hgb urine dipstick TRACE (*)    All other components within normal limits  LIPASE, BLOOD - Abnormal; Notable for the following components:   Lipase 55 (*)    All other components within normal limits  COMPREHENSIVE  METABOLIC PANEL  CBC WITH DIFFERENTIAL/PLATELET  TROPONIN I (HIGH SENSITIVITY)    EKG   Radiology No results found.  Procedures ED EKG  Date/Time: 01/07/2023 10:20 AM  Performed by: Shirlee Latch, PA-C Authorized by: Eusebio Friendly  B, PA-C   Previous ECG:    Previous ECG:  Compared to current   Similarity:  Changes noted   Comparison ECG info:  Compared to 04/20/21. Changes in lead III--flipped t wave in old EKG Interpretation:    Interpretation: abnormal   Rate:    ECG rate:  65   ECG rate assessment: normal   Rhythm:    Rhythm: sinus rhythm   Ectopy:    Ectopy: none   QRS:    QRS axis:  Normal   QRS intervals:  Normal   QRS conduction: normal   ST segments:    ST segments:  Non-specific   Details:  Slight elevation in aVF (present in old EKG) T waves:    T waves: normal   Comments:     Normal sinus rhythm. Non specific ST change in aVF  (including critical care time)  Medications Ordered in UC Medications  alum & mag hydroxide-simeth (MAALOX/MYLANTA) 200-200-20 MG/5ML suspension 30 mL (30 mLs Oral Given 01/07/23 1000)  lidocaine (XYLOCAINE) 2 % viscous mouth solution 15 mL (15 mLs Mouth/Throat Given 01/07/23 1000)    Initial Impression / Assessment and Plan / UC Course  I have reviewed the triage vital signs and the nursing notes.  Pertinent labs & imaging results that were available during my care of the patient were reviewed by me and considered in my medical decision making (see chart for details).   47 year old male presents for left upper quadrant abdominal pain x 3 days.  Pain is dull and aching.  Pain is worse with eating spicy or greasy foods or drinking alcohol.  He has a history of GERD and anxiety.  Not currently taking any antacids.  He denies any pain in his chest, shortness of breath or palpitations.  No history of heart disease.  BP 127/96.  Other vitals normal and stable.  He is overall well-appearing.  No acute distress.  On exam heart regular  rate and rhythm.  Chest clear to auscultation.  Mild tenderness with deep palpation to left upper quadrant.  Patient given GI cocktail.  Given the discomfort is on the left upper side, ordered EKG.  This EKG compared to EKG from September 2022 shows previous flipped T wave in lead III is now normal-appearing.  Nonspecific ST wave abnormality present and this EKG and previous EKG and lead aVF.  Ordered CBC, CMP, lipase and troponin.  Urinalysis was ordered by nursing staff which shows only trace hemoglobin.  CBC is normal and so is the CMP.  Lipase slightly elevated at 55.  Patient did not tolerate the GI cocktail.  Reported that it caused him to feel nauseous and he threw it up.  Troponin negative.  Discussed all results with patient.  Presentation is most consistent with gastritis and GERD.  Will treat patient at this time with pantoprazole and Pepto-Bismol.  Discussed dietary changes and avoiding triggers.  Reviewed ED precautions.   Final Clinical Impressions(s) / UC Diagnoses   Final diagnoses:  Abdominal pain, left upper quadrant  Acute gastritis without hemorrhage, unspecified gastritis type  Nausea  Nonspecific abnormal electrocardiogram (ECG) (EKG)  Gastroesophageal reflux disease, unspecified whether esophagitis present     Discharge Instructions      -Your workup today is reassuring. - You have gastritis related to acid reflux. - Try to identify and avoid your known triggers such as alcohol, caffeine, NSAIDs, spicy/greasy or fatty foods. - Eat smaller meals and not close to bedtime. - Try the antacid medication I  sent to pharmacy.  You can also add Pepto-Bismol.  If this occurs again you may purchase over-the-counter omeprazole. - If your pain worsens or you have any coffee-ground type emesis, weakness, fever, increased chest discomfort, shortness of breath or racing heart, please go to the ER.     ED Prescriptions     Medication Sig Dispense Auth. Provider    pantoprazole (PROTONIX) 20 MG tablet Take 1 tablet (20 mg total) by mouth daily for 14 days. 14 tablet Gareth Morgan      PDMP not reviewed this encounter.   Shirlee Latch, PA-C 01/07/23 1101

## 2023-01-07 NOTE — ED Triage Notes (Signed)
Patient with c/o left upper abdominal pain and lower back pain. Patient states he has had chills and body aches as well. Denies fever at home.

## 2023-01-07 NOTE — Discharge Instructions (Addendum)
-  Your workup today is reassuring. - You have gastritis related to acid reflux. - Try to identify and avoid your known triggers such as alcohol, caffeine, NSAIDs, spicy/greasy or fatty foods. - Eat smaller meals and not close to bedtime. - Try the antacid medication I sent to pharmacy.  You can also add Pepto-Bismol.  If this occurs again you may purchase over-the-counter omeprazole. - If your pain worsens or you have any coffee-ground type emesis, weakness, fever, increased chest discomfort, shortness of breath or racing heart, please go to the ER.

## 2024-02-06 ENCOUNTER — Ambulatory Visit
Admission: EM | Admit: 2024-02-06 | Discharge: 2024-02-06 | Disposition: A | Discharge status: Home / Self Care | Payer: PRIVATE HEALTH INSURANCE | Attending: Emergency Medicine | Admitting: Emergency Medicine

## 2024-02-06 ENCOUNTER — Encounter: Payer: Self-pay | Admitting: Emergency Medicine

## 2024-02-06 DIAGNOSIS — Z203 Contact with and (suspected) exposure to rabies: Secondary | ICD-10-CM | POA: Diagnosis not present

## 2024-02-06 DIAGNOSIS — Z23 Encounter for immunization: Secondary | ICD-10-CM

## 2024-02-06 MED ORDER — RABIES VACCINE, PCEC IM SUSR
1.0000 mL | Freq: Once | INTRAMUSCULAR | Status: AC
  Administered 2024-02-06: 1 mL via INTRAMUSCULAR

## 2024-02-06 MED ORDER — RABIES IMMUNE GLOBULIN 300 UNIT/2ML IJ SOLN
20.0000 [IU]/kg | Freq: Once | INTRAMUSCULAR | Status: AC
  Administered 2024-02-06: 1800 [IU]

## 2024-02-06 NOTE — ED Provider Notes (Signed)
 [facility]    CSN: [id number] Arrival date & time: 02/06/24  1228      History   Chief Complaint Chief Complaint  Patient presents with   Rabies Injection    HPI John Dyer is a 48 y.o. male.   48 year old male pt, John Dyer, presents to urgent care for rabies vaccine series.   Per pt report, pt was recommended by local health department to get rabies vaccine since exposure to bats in house 2 days prior. Unknown exposure to mucous membranes, no obvious injury noted. Pt reports his tetanus shot was less than 5 years prior and is UTD.  The history is provided by the patient. No language interpreter was used.    Past Medical History:  Diagnosis Date   Anxiety    Anxiety, generalized 05/25/2015   Awareness of heartbeats 05/08/2011   Cardiac murmur 05/25/2015   Cervicalgia    Depression    Fatigue    GAD (generalized anxiety disorder) 05/25/2015   Gastro-esophageal reflux disease without esophagitis 05/25/2015   GERD (gastroesophageal reflux disease)    GERD (gastroesophageal reflux disease) 05/25/2015   Heart murmur    Major depressive disorder with single episode 05/25/2015   Panic disorder     Patient Active Problem List   Diagnosis Date Noted   Need for post exposure prophylaxis for rabies 02/06/2024   Pelvic pain in male 03/31/2016   Dysuria 01/30/2016   Heart murmur 05/25/2015   Depressive disorder 05/25/2015   GERD (gastroesophageal reflux disease) 05/25/2015   GAD (generalized anxiety disorder) 05/25/2015   Cervicalgia 05/25/2015   Cardiac murmur 05/25/2015   Gastro-esophageal reflux disease without esophagitis 05/25/2015   Anxiety, generalized 05/25/2015   Major depressive disorder with single episode 05/25/2015   Awareness of heartbeats 05/08/2011    Past Surgical History:  Procedure Laterality Date   NO PAST SURGERIES         Home Medications    Prior to Admission medications   Medication Sig Start Date End Date Taking?  Authorizing Provider  clobetasol cream (TEMOVATE) 0.05 % Apply 1 Application topically 2 (two) times daily. 09/23/22   [provider]  pantoprazole  (PROTONIX ) 20 MG tablet Take 1 tablet (20 mg total) by mouth daily for 14 days. 01/07/23 01/21/23  Floydene Hy, PA-C    Family History Family History  Problem Relation Age of Onset   Heart disease Father        murmur   Stroke Father    Cancer Maternal Grandmother        leukemia    Social History Social History   Tobacco Use   Smoking status: Never   Smokeless tobacco: Never  Vaping Use   Vaping status: Never Used  Substance Use Topics   Alcohol use: Yes    Alcohol/week: 0.0 standard drinks of alcohol   Drug use: No     Allergies   Penicillin g benzathine and Prozac [fluoxetine hcl]   Review of Systems Review of Systems  Constitutional:  Negative for fever.  Skin: Negative.   All other systems reviewed and are negative.    Physical Exam Triage Vital Signs ED Triage Vitals  Encounter Vitals Group     BP 02/06/24 1247 129/81     Girls Systolic BP Percentile --      Girls Diastolic BP Percentile --      Boys Systolic BP Percentile --      Boys Diastolic BP Percentile --  Pulse Rate 02/06/24 1247 75     Resp 02/06/24 1247 15     Temp 02/06/24 1247 98.8 F (37.1 C)     Temp Source 02/06/24 1247 Oral     SpO2 02/06/24 1247 96 %     Weight 02/06/24 1246 189 lb 6.4 oz (85.9 kg)     Height --      Head Circumference --      Peak Flow --      Pain Score 02/06/24 1246 0     Pain Loc --      Pain Education --      Exclude from Growth Chart --    No data found.  Updated Vital Signs BP 129/81 (BP Location: Right Arm)   Pulse 75   Temp 98.8 F (37.1 C) (Oral)   Resp 15   Wt 189 lb 6.4 oz (85.9 kg)   SpO2 96%   BMI 27.97 kg/m   Visual Acuity Right Eye Distance:   Left Eye Distance:   Bilateral Distance:    Right Eye Near:   Left Eye Near:    Bilateral Near:     Physical Exam Vitals and  nursing note reviewed.  Constitutional:      General: He is not in acute distress.    Appearance: He is well-developed and well-groomed.  HENT:     Head: Normocephalic and atraumatic.   Eyes:     Conjunctiva/sclera: Conjunctivae normal.    Cardiovascular:     Rate and Rhythm: Normal rate.     Heart sounds: No murmur heard. Pulmonary:     Effort: Pulmonary effort is normal. No respiratory distress.  Abdominal:     Palpations: Abdomen is soft.     Tenderness: There is no abdominal tenderness.   Musculoskeletal:        General: No swelling.     Cervical back: Neck supple.   Skin:    General: Skin is warm and dry.     Capillary Refill: Capillary refill takes less than 2 seconds.   Neurological:     General: No focal deficit present.     Mental Status: He is alert and oriented to person, place, and time.     GCS: GCS eye subscore is 4. GCS verbal subscore is 5. GCS motor subscore is 6.   Psychiatric:        Mood and Affect: Mood normal.        Behavior: Behavior is cooperative.      UC Treatments / Results  Labs (all labs ordered are listed, but only abnormal results are displayed) Labs Reviewed - No data to display  EKG   Radiology No results found.  Procedures Procedures (including critical care time)  Medications Ordered in UC Medications  rabies immune globulin (HYPERRAB) injection 1,800 Units (1,800 Units Infiltration Given 02/06/24 1313)  rabies vaccine (RABAVERT) injection 1 mL (1 mL Intramuscular Given 02/06/24 1308)    Initial Impression / Assessment and Plan / UC Course  I have reviewed the triage vital signs and the nursing notes.  Pertinent labs & imaging results that were available during my care of the patient were reviewed by me and considered in my medical decision making (see chart for details).    Discussed exam findings and plan of care with patient, return on day 3 for 2nd series in rabies shots, strict go to ER precautions given.    Patient verbalized understanding to this provider.  Ddx: Need for post exposure prophylaxis for  rabies Final Clinical Impressions(s) / UC Diagnoses   Final diagnoses:  Need for post exposure prophylaxis for rabies     Discharge Instructions      Please return in 3 days for next rabies injection in series, will need to return on day 7(6/27)and 14( 07/04) as well to complete series  Avoid home with bats  Go to ER for new or worsening issues or concerns     ED Prescriptions   None    PDMP not reviewed this encounter.   Peter [employer]ands, NP 02/06/24 2126

## 2024-02-06 NOTE — Discharge Instructions (Addendum)
 Please return in 3 days for next rabies injection in series, will need to return on day 7(6/27)and 14( 07/04) as well to complete series  Avoid home with bats  Go to ER for new or worsening issues or concerns

## 2024-02-06 NOTE — ED Triage Notes (Signed)
 Patient states that he was building in his new house and noticed bats in the house 2 days ago.  Patient states that he was recommended to get the rabies series.  Patient does not recall any bat bites.

## 2024-02-09 ENCOUNTER — Ambulatory Visit
Admission: EM | Admit: 2024-02-09 | Discharge: 2024-02-09 | Disposition: A | Discharge status: Home / Self Care | Payer: PRIVATE HEALTH INSURANCE | Attending: Physician Assistant | Admitting: Physician Assistant

## 2024-02-09 MED ORDER — RABIES VACCINE, PCEC IM SUSR
1.0000 mL | Freq: Once | INTRAMUSCULAR | Status: AC
  Administered 2024-02-09: 1 mL via INTRAMUSCULAR

## 2024-02-09 NOTE — ED Triage Notes (Signed)
 Pt presents to UC for 2nd dose of rabies vaccine.

## 2024-02-13 ENCOUNTER — Ambulatory Visit
Admission: EM | Admit: 2024-02-13 | Discharge: 2024-02-13 | Disposition: A | Discharge status: Home / Self Care | Payer: PRIVATE HEALTH INSURANCE | Attending: Family Medicine | Admitting: Family Medicine

## 2024-02-13 DIAGNOSIS — Z203 Contact with and (suspected) exposure to rabies: Secondary | ICD-10-CM

## 2024-02-13 DIAGNOSIS — Z23 Encounter for immunization: Secondary | ICD-10-CM | POA: Diagnosis not present

## 2024-02-13 MED ORDER — RABIES VACCINE, PCEC IM SUSR
1.0000 mL | Freq: Once | INTRAMUSCULAR | Status: AC
  Administered 2024-02-13: 1 mL via INTRAMUSCULAR

## 2024-02-13 NOTE — ED Triage Notes (Signed)
 Patient is here for his Day 7 Rabies Vaccine . Patient states that his last vaccine was given in left deltoid.  Patient denies any reaction or pain.

## 2024-02-13 NOTE — Discharge Instructions (Signed)
 Return on July 4 for last rabies vaccine 

## 2024-02-20 ENCOUNTER — Ambulatory Visit
Admission: EM | Admit: 2024-02-20 | Discharge: 2024-02-20 | Disposition: A | Discharge status: Home / Self Care | Payer: PRIVATE HEALTH INSURANCE | Attending: Family Medicine | Admitting: Family Medicine

## 2024-02-20 DIAGNOSIS — Z203 Contact with and (suspected) exposure to rabies: Secondary | ICD-10-CM | POA: Diagnosis not present

## 2024-02-20 DIAGNOSIS — Z23 Encounter for immunization: Secondary | ICD-10-CM

## 2024-02-20 MED ORDER — RABIES VACCINE, PCEC IM SUSR
1.0000 mL | Freq: Once | INTRAMUSCULAR | Status: AC
  Administered 2024-02-20: 1 mL via INTRAMUSCULAR

## 2024-02-20 NOTE — Discharge Instructions (Signed)
 You have finished your Rabies Series Inections

## 2024-02-20 NOTE — ED Triage Notes (Signed)
 Patient is here for his last Rabies Vaccine  injection. Patient denies any problems or reactions to the vaccines.
